# Patient Record
Sex: Female | Born: 1985 | Race: White | Hispanic: No | Marital: Single | Smoking: Never smoker
Health system: Southern US, Community
[De-identification: ages and names within clinical notes are randomized; demographics above are authoritative.]

## PROBLEM LIST (undated history)

## (undated) DIAGNOSIS — I10 Essential (primary) hypertension: Secondary | ICD-10-CM

---

## 2019-06-18 ENCOUNTER — Other Ambulatory Visit: Payer: Self-pay

## 2019-06-18 ENCOUNTER — Encounter: Payer: Self-pay | Admitting: Emergency Medicine

## 2019-06-18 ENCOUNTER — Emergency Department (INDEPENDENT_AMBULATORY_CARE_PROVIDER_SITE_OTHER)
Admission: EM | Admit: 2019-06-18 | Discharge: 2019-06-18 | Disposition: A | Discharge status: Home / Self Care | Payer: Managed Care, Other (non HMO) | Source: Home / Self Care

## 2019-06-18 ENCOUNTER — Emergency Department (INDEPENDENT_AMBULATORY_CARE_PROVIDER_SITE_OTHER): Payer: Managed Care, Other (non HMO)

## 2019-06-18 DIAGNOSIS — M25572 Pain in left ankle and joints of left foot: Secondary | ICD-10-CM

## 2019-06-18 DIAGNOSIS — S93402A Sprain of unspecified ligament of left ankle, initial encounter: Secondary | ICD-10-CM

## 2019-06-18 HISTORY — DX: Essential (primary) hypertension: I10

## 2019-06-18 MED ORDER — HYDROCODONE-ACETAMINOPHEN 5-325 MG PO TABS: 1.0000 | ORAL_TABLET | Freq: Four times a day (QID) | ORAL | 0 refills | Status: DC | PRN

## 2019-06-18 MED ORDER — IBUPROFEN 600 MG PO TABS: 600.0000 mg | ORAL_TABLET | Freq: Four times a day (QID) | ORAL | 0 refills | Status: DC | PRN

## 2019-06-18 NOTE — ED Provider Notes (Signed)
[facility]    CSN: [id number] Arrival date & time: 06/18/19  1052      History   Chief Complaint Chief Complaint  Patient presents with  . Ankle Pain    HPI Charlene Quinn is a 33 y.o. female.   HPI  Charlene Quinn is a 33 y.o. female presenting to UC with c/o sudden onset, gradually worsening Left ankle pain and swelling that started last night after falling off the last step of a staircase in her house.  Pain is aching and throbbing, 6/10, worse with movement and weightbearing.  She has applied ice and took ibuprofen with mild relief. No other injuries from the fall.  She used a knee walker scooter to come into UC today.   Past Medical History:  Diagnosis Date  . Hypertension     There are no active problems to display for this patient.   History reviewed. No pertinent surgical history.  OB History   No obstetric history on file.      Home Medications    Prior to Admission medications   Medication Sig Start Date End Date Taking? Authorizing Provider  hydrochlorothiazide (MICROZIDE) 12.5 MG capsule Take by mouth. 11/30/17  Yes [provider]  levonorgestrel (MIRENA) 20 MCG/24HR IUD by Intrauterine route.   Yes [provider]  HYDROcodone-acetaminophen (NORCO/VICODIN) 5-325 MG tablet Take 1-2 tablets by mouth every 6 (six) hours as needed for moderate pain or severe pain. 06/18/19   Noe Gens, PA-C  ibuprofen (ADVIL) 600 MG tablet Take 1 tablet (600 mg total) by mouth every 6 (six) hours as needed. 06/18/19   Noe Gens, PA-C    Family History No family history on file.  Social History Social History   Tobacco Use  . Smoking status: Never Smoker  . Smokeless tobacco: Never Used  Substance Use Topics  . Alcohol use: Not on file  . Drug use: Not on file     Allergies   Lisinopril   Review of Systems Review of Systems  Musculoskeletal: Positive for arthralgias, gait problem and joint swelling.  Skin: Positive  for color change. Negative for wound.  Neurological: Positive for weakness ( Left ankle due to pain). Negative for numbness.     Physical Exam Triage Vital Signs ED Triage Vitals [06/18/19 1103]  Enc Vitals Group     BP (!) 176/83     Pulse Rate (!) 110     Resp 17     Temp 98.5 F (36.9 C)     Temp Source Oral     SpO2 97 %     Weight 165 lb (74.8 kg)     Height 5\' 4"  (1.626 m)     Head Circumference      Peak Flow      Pain Score      Pain Loc      Pain Edu?      Excl. in [place]?    No data found.  Updated Vital Signs BP (!) 176/83 (BP Location: Right Arm)   Pulse 99   Temp 98.5 F (36.9 C) (Oral)   Resp 17   Ht 5\' 4"  (1.626 m)   Wt 165 lb (74.8 kg)   SpO2 97%   BMI 28.32 kg/m       Physical Exam Vitals signs and nursing note reviewed.  Constitutional:      Appearance: She is well-developed.  HENT:     Head: Normocephalic and atraumatic.  Neck:  Musculoskeletal: Normal range of motion.  Cardiovascular:     Rate and Rhythm: Normal rate.     Pulses:          Dorsalis pedis pulses are 2+ on the left side.       Posterior tibial pulses are 2+ on the left side.     Comments: Initially tachycardic in triage, regular rate and rhythm on exam. Pulmonary:     Effort: Pulmonary effort is normal.  Musculoskeletal:        General: Swelling and tenderness present.     Comments: Left ankle: moderate to significant edema. Mild diffuse tenderness to medial, lateral and anterior aspect. Limited dorsiflexion and plantarflexion due to pain.  Calf is soft, non-tender. No tenderness of foot, full ROM all toes.  Skin:    General: Skin is warm and dry.     Capillary Refill: Capillary refill takes less than 2 seconds.     Findings: Bruising present.     Comments: Left ankle: skin in tact. Faint ecchymosis   Neurological:     Mental Status: She is alert and oriented to person, place, and time.     Comments: Left foot: normal sensation  Psychiatric:        Behavior:  Behavior normal.      UC Treatments / Results  Labs (all labs ordered are listed, but only abnormal results are displayed) Labs Reviewed - No data to display  EKG   Radiology Dg Ankle Complete Left  Result Date: 06/18/2019 CLINICAL DATA:  33 year old female with left ankle pain and swelling EXAM: LEFT ANKLE COMPLETE - 3+ VIEW COMPARISON:  None. FINDINGS: Significant swelling overlying the distal left fibula, with lesser degree of swelling at the medial malleolus. No displaced fracture identified. No radiopaque foreign body. Lateral view demonstrates tibiotalar joint effusion. IMPRESSION: Significant soft tissue swelling overlying the distal left fibula with no acute bony abnormality. Likely small tibiotalar joint effusion. Electronically Signed   By: Gilmer Mor D.O.   On: 06/18/2019 11:38    Procedures Procedures (including critical care time)  Medications Ordered in UC Medications - No data to display  Initial Impression / Assessment and Plan / UC Course  I have reviewed the triage vital signs and the nursing notes.  Pertinent labs & imaging results that were available during my care of the patient were reviewed by me and considered in my medical decision making (see chart for details).     Discussed imaging with pt  Will treat as severe ankle sprain. Offered crutches, pt prefers to use the knee walker scooter Ace wrap and ankle splint provided Work note and AVS provided  Final Clinical Impressions(s) / UC Diagnoses   Final diagnoses:  Severe ankle sprain, left, initial encounter     Discharge Instructions      You may take 500mg  acetaminophen every 4-6 hours or in combination with ibuprofen 400-600mg  every 6-8 hours as needed for pain and inflammation.  Norco/Vicodin (hydrocodone-acetaminophen) is a narcotic pain medication, do not combine these medications with others containing tylenol. While taking, do not drink alcohol, drive, or perform any other activities  that requires focus while taking these medications.   Call tomorrow to schedule an appointment with Sports Medicine later this week for further evaluation and treatment of your ankle injury.     ED Prescriptions    Medication Sig Dispense Auth. Provider   HYDROcodone-acetaminophen (NORCO/VICODIN) 5-325 MG tablet Take 1-2 tablets by mouth every 6 (six) hours as needed for moderate pain  or severe pain. 12 tablet Doroteo GlassmanPhelps, Tmya Wigington O, PA-C   ibuprofen (ADVIL) 600 MG tablet Take 1 tablet (600 mg total) by mouth every 6 (six) hours as needed. 30 tablet Lurene ShadowPhelps, Haaris Metallo O, [place]PA-C     I have reviewed the PDMP during this encounter.   Lurene Shadowhelps, Jolynne Spurgin O, [place]-C 06/18/19 1211

## 2019-06-18 NOTE — Discharge Instructions (Signed)
°  You may take 500mg  acetaminophen every 4-6 hours or in combination with ibuprofen 400-600mg  every 6-8 hours as needed for pain and inflammation.  Norco/Vicodin (hydrocodone-acetaminophen) is a narcotic pain medication, do not combine these medications with others containing tylenol. While taking, do not drink alcohol, drive, or perform any other activities that requires focus while taking these medications.   Call tomorrow to schedule an appointment with Sports Medicine later this week for further evaluation and treatment of your ankle injury.

## 2019-06-18 NOTE — ED Triage Notes (Signed)
Patient fell last night, having left ankle pain

## 2019-06-19 ENCOUNTER — Encounter: Payer: Self-pay | Admitting: Sports Medicine

## 2019-06-19 ENCOUNTER — Ambulatory Visit (INDEPENDENT_AMBULATORY_CARE_PROVIDER_SITE_OTHER): Payer: Managed Care, Other (non HMO) | Admitting: Sports Medicine

## 2019-06-19 DIAGNOSIS — S93492A Sprain of other ligament of left ankle, initial encounter: Secondary | ICD-10-CM | POA: Diagnosis not present

## 2019-06-19 DIAGNOSIS — T148XXA Other injury of unspecified body region, initial encounter: Secondary | ICD-10-CM | POA: Insufficient documentation

## 2019-06-19 DIAGNOSIS — S93402A Sprain of unspecified ligament of left ankle, initial encounter: Secondary | ICD-10-CM | POA: Insufficient documentation

## 2019-06-19 NOTE — Assessment & Plan Note (Signed)
Suspected grade 3 ATFL sprain with positive anterior drawer sign. Continue ASO. Continue pain medications, nonweightbearing with crutches for now. Seated duty only at work, I do suspect she will need short-term disability.

## 2019-06-19 NOTE — Progress Notes (Signed)
Subjective:    CC: Ankle injury  HPI:  This is a very pleasant 33 year old female, she fell and twisted her ankle, she had immediate pain, swelling, bruising, was seen in urgent care where x-rays were negative for fracture, she was placed in an ASO and referred to me for further evaluation.  Pain is severe, localized at the ATFL without radiation.  I reviewed the past medical history, family history, social history, surgical history, and allergies today and no changes were needed.  Please see the problem list section below in epic for further details.  Past Medical History: Past Medical History:  Diagnosis Date  . Hypertension    Past Surgical History: No past surgical history on file. Social History: Social History   Socioeconomic History  . Marital status: Single    Spouse name: Not on file  . Number of children: Not on file  . Years of education: Not on file  . Highest education level: Not on file  Occupational History  . Not on file  Social Needs  . Financial resource strain: Not on file  . Food insecurity    Worry: Not on file    Inability: Not on file  . Transportation needs    Medical: Not on file    Non-medical: Not on file  Tobacco Use  . Smoking status: Never Smoker  . Smokeless tobacco: Never Used  Substance and Sexual Activity  . Alcohol use: Not on file  . Drug use: Not on file  . Sexual activity: Not on file  Lifestyle  . Physical activity    Days per week: Not on file    Minutes per session: Not on file  . Stress: Not on file  Relationships  . Social Herbalist on phone: Not on file    Gets together: Not on file    Attends religious service: Not on file    Active member of club or organization: Not on file    Attends meetings of clubs or organizations: Not on file    Relationship status: Not on file  Other Topics Concern  . Not on file  Social History Narrative  . Not on file   Family History: No family history on file.  Allergies: Allergies  Allergen Reactions  . Lisinopril Cough and Other (See Comments)   Medications: See med rec.  Review of Systems: No headache, visual changes, nausea, vomiting, diarrhea, constipation, dizziness, abdominal pain, skin rash, fevers, chills, night sweats, swollen lymph nodes, weight loss, chest pain, body aches, joint swelling, muscle aches, shortness of breath, mood changes, visual or auditory hallucinations.  Objective:    General: Well Developed, well nourished, and in no acute distress.  Neuro: Alert and oriented x3, extra-ocular muscles intact, sensation grossly intact.  HEENT: Normocephalic, atraumatic, pupils equal round reactive to light, neck supple, no masses, no lymphadenopathy, thyroid nonpalpable.  Skin: Warm and dry, no rashes noted.  Cardiac: Regular rate and rhythm, no murmurs rubs or gallops.  Respiratory: Clear to auscultation bilaterally. Not using accessory muscles, speaking in full sentences.  Abdominal: Soft, nontender, nondistended, positive bowel sounds, no masses, no organomegaly.  Left ankle: Swollen, bruised Range of motion is full in all directions. Strength is 5/5 in all directions. Unstable lateral ligaments, positive anterior drawer sign. Talar dome nontender; No pain at base of 5th MT; No tenderness over cuboid; No tenderness over N spot or navicular prominence No tenderness on posterior aspects of lateral and medial malleolus No sign of peroneal tendon  subluxations; Negative tarsal tunnel tinel's Unable to bear weight  X-rays reviewed and are negative for obvious fracture.  Impression and Recommendations:    The patient was counselled, risk factors were discussed, anticipatory guidance given.  Sprain of ankle, left Suspected grade 3 ATFL sprain with positive anterior drawer sign. Continue ASO. Continue pain medications, nonweightbearing with crutches for now. Seated duty only at work, I do suspect she will need short-term  disability.   ___________________________________________ Ihor Austin. Benjamin Stain, M.D., ABFM., CAQSM. Primary Care and Sports Medicine [facility]  Adjunct Professor of Family Medicine  [employer]

## 2019-06-21 ENCOUNTER — Telehealth: Payer: Self-pay | Admitting: Sports Medicine

## 2019-06-21 NOTE — Telephone Encounter (Signed)
Disability/FMLA paperwork filled out

## 2019-07-03 ENCOUNTER — Other Ambulatory Visit: Payer: Self-pay

## 2019-07-03 ENCOUNTER — Encounter: Payer: Self-pay | Admitting: Sports Medicine

## 2019-07-03 ENCOUNTER — Ambulatory Visit (INDEPENDENT_AMBULATORY_CARE_PROVIDER_SITE_OTHER): Payer: Managed Care, Other (non HMO)

## 2019-07-03 ENCOUNTER — Ambulatory Visit (INDEPENDENT_AMBULATORY_CARE_PROVIDER_SITE_OTHER): Payer: Managed Care, Other (non HMO) | Admitting: Sports Medicine

## 2019-07-03 DIAGNOSIS — S93492D Sprain of other ligament of left ankle, subsequent encounter: Secondary | ICD-10-CM

## 2019-07-03 MED ORDER — HYDROCODONE-ACETAMINOPHEN 5-325 MG PO TABS: 1.0000 | ORAL_TABLET | Freq: Two times a day (BID) | ORAL | 0 refills | Status: DC | PRN

## 2019-07-03 NOTE — Assessment & Plan Note (Signed)
Still with persistent pain in the ankle, question talar OCD laterally, x-rays were negative, continuing to have difficulty bearing weight. MRI today, continue wrap and ASO, stay out of work for now.

## 2019-07-03 NOTE — Progress Notes (Signed)
Subjective:    CC: Follow-up  HPI: This is a pleasant 33 year old female, she had a severe injury in her left ankle, she returns today after 2 weeks of nonweightbearing with a ASO.  Still has significant discomfort.  I reviewed the past medical history, family history, social history, surgical history, and allergies today and no changes were needed.  Please see the problem list section below in epic for further details.  Past Medical History: Past Medical History:  Diagnosis Date  . Hypertension    Past Surgical History: No past surgical history on file. Social History: Social History   Socioeconomic History  . Marital status: Single    Spouse name: Not on file  . Number of children: Not on file  . Years of education: Not on file  . Highest education level: Not on file  Occupational History  . Not on file  Social Needs  . Financial resource strain: Not on file  . Food insecurity    Worry: Not on file    Inability: Not on file  . Transportation needs    Medical: Not on file    Non-medical: Not on file  Tobacco Use  . Smoking status: Never Smoker  . Smokeless tobacco: Never Used  Substance and Sexual Activity  . Alcohol use: Not on file  . Drug use: Not on file  . Sexual activity: Not on file  Lifestyle  . Physical activity    Days per week: Not on file    Minutes per session: Not on file  . Stress: Not on file  Relationships  . Social Herbalist on phone: Not on file    Gets together: Not on file    Attends religious service: Not on file    Active member of club or organization: Not on file    Attends meetings of clubs or organizations: Not on file    Relationship status: Not on file  Other Topics Concern  . Not on file  Social History Narrative  . Not on file   Family History: No family history on file. Allergies: Allergies  Allergen Reactions  . Lisinopril Cough and Other (See Comments)   Medications: See med rec.  Review of Systems:  No fevers, chills, night sweats, weight loss, chest pain, or shortness of breath.   Objective:    General: Well Developed, well nourished, and in no acute distress.  Neuro: Alert and oriented x3, extra-ocular muscles intact, sensation grossly intact.  HEENT: Normocephalic, atraumatic, pupils equal round reactive to light, neck supple, no masses, no lymphadenopathy, thyroid nonpalpable.  Skin: Warm and dry, no rashes. Cardiac: Regular rate and rhythm, no murmurs rubs or gallops, no lower extremity edema.  Respiratory: Clear to auscultation bilaterally. Not using accessory muscles, speaking in full sentences. Left ankle: Swollen, bruised Range of motion is full in all directions. Strength is 5/5 in all directions. Squeeze and Kleiger tests are negative, lateral ligaments are unstable Tender to palpation over the lateral talar dome No pain at base of 5th MT; No tenderness over cuboid; No tenderness over N spot or navicular prominence Tender behind the lateral malleolus No sign of peroneal tendon subluxations; Negative tarsal tunnel tinel's Unable to bear weight  Impression and Recommendations:    Sprain of ankle, left Still with persistent pain in the ankle, question talar OCD laterally, x-rays were negative, continuing to have difficulty bearing weight. MRI today, continue wrap and ASO, stay out of work for now.   ___________________________________________ Gwen Her.  Dianah Field, M.D., ABFM., CAQSM. Primary Care and Sports Medicine [facility]  Adjunct Professor of [employer]

## 2019-07-05 NOTE — Addendum Note (Signed)
Addended by: Silverio Decamp on: 07/05/2019 04:45 PM   Modules accepted: Orders

## 2019-07-13 ENCOUNTER — Ambulatory Visit: Payer: Managed Care, Other (non HMO) | Admitting: Rehabilitative and Restorative Service Providers"

## 2019-07-17 ENCOUNTER — Ambulatory Visit (INDEPENDENT_AMBULATORY_CARE_PROVIDER_SITE_OTHER): Payer: Managed Care, Other (non HMO) | Admitting: Rehabilitative and Restorative Service Providers"

## 2019-07-17 ENCOUNTER — Other Ambulatory Visit: Payer: Self-pay

## 2019-07-17 DIAGNOSIS — R2689 Other abnormalities of gait and mobility: Secondary | ICD-10-CM

## 2019-07-17 DIAGNOSIS — M25572 Pain in left ankle and joints of left foot: Secondary | ICD-10-CM | POA: Diagnosis not present

## 2019-07-17 DIAGNOSIS — R29898 Other symptoms and signs involving the musculoskeletal system: Secondary | ICD-10-CM

## 2019-07-17 DIAGNOSIS — M6281 Muscle weakness (generalized): Secondary | ICD-10-CM | POA: Diagnosis not present

## 2019-07-17 NOTE — Therapy (Signed)
[facility] [place] [address] [place], [place], [address] Phone: [phone]   Fax:  [phone]  Physical Therapy Evaluation  Patient Details  Name: Charlene Quinn MRN: [id number] Date of Birth: February 17, 1986 Referring Provider (PT): Monica Becton, MD   Encounter Date: 07/17/2019  PT End of Session - 07/17/19 1423    Visit Number  1    Number of Visits  16    Date for PT Re-Evaluation  09/15/19    Authorization Type  cigna 60 visits    Authorization - Visit Number  1    Authorization - Number of Visits  60    PT Start Time  1348    PT Stop Time  1432    PT Time Calculation (min)  44 min       Past Medical History:  Diagnosis Date  . Hypertension     No past surgical history on file.  There were no vitals filed for this visit.   Subjective Assessment - 07/17/19 1349    Subjective  The patient had a fall on the stairs on 06/17/2019.  She is currently using an ace wrap on the L ankle with a lace up ankle brace (ASO).  She is tolerating toe touch weight bearing only at this time and using crutches.    Pertinent History  None    Diagnostic tests  IMPRESSION:1. Bone contusions of the medial malleolus and medial talar body andneck consistent with inversion injury. No cortical fracture or talardome osteochondral lesion identified.2. Lateral ligamentous injuries involving the anterior talofibularand anterior inferior tibiofibular ligaments with probable lateralcapsular extravasation.3. The ankle tendons appear intact. 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    Patient Stated Goals  Be able to walk without crutches and pain.    Currently in Pain?  Yes    Pain Score  --   depends on amount of weight to load   Pain Location  Ankle    Pain Orientation  Left    Pain Descriptors / Indicators  Aching   notes pain in achilles region   Pain Type  Acute pain    Pain Onset  1 to 4 weeks ago    Pain Frequency  Intermittent     Aggravating Factors   weight bearing    Pain Relieving Factors  rest, non weight bearing         [facility] PT Assessment - 07/17/19 1351      Assessment   Medical Diagnosis  S93.492D (ICD-10-CM) - Sprain of anterior talofibular ligament of left ankle, subsequent encounter    Referring Provider (PT)  Monica Becton, MD    Onset Date/Surgical Date  06/17/19    Prior Therapy  none      Precautions   Precaution Comments  patient is ambulating toe touch weight bearing, she has not been limited by MD on weight bearing, reporting unable to bear weight due to significant pain    Required Braces or Orthoses  Other Brace/Splint    Other Brace/Splint  She wears ASO L ankle.  Will continue to use with weight bearing progression to provide stability initially.      Restrictions   Other Position/Activity Restrictions  WBAT      Balance Screen   Has the patient fallen in the past 6 months  Yes    How many times?  injured ankle due to fall on step    Has the patient had a decrease in activity level because of a fear of falling?  Yes   due to nature of injury   Is the patient reluctant to leave their home because of a fear of falling?   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      Observation/Other Assessments   Focus on Therapeutic Outcomes (FOTO)   29%      Sensation   Light Touch  Appears Intact      ROM / Strength   AROM / PROM / Strength  AROM;Strength;PROM      AROM   Overall AROM   Deficits    AROM Assessment Site  Ankle   in long sitting/ foot off edge of mat   Right/Left Ankle  Left    Left Ankle Dorsiflexion  -12    Left Ankle Plantar Flexion  40     Left Ankle Inversion  8   had to move through PROM before patient could elicit contrac   Left Ankle Eversion  8      PROM   Overall PROM   Deficits    PROM Assessment Site  Ankle    Right/Left Ankle  Left    Left Ankle Dorsiflexion  -5      Strength   Overall Strength Comments  *patient is able to perform ankle DF/PF and inversion/eversion against gravity.  She cannot tolerate resistance due to pain.      Flexibility   Soft Tissue Assessment /Muscle Length  yes   significant tightness in gastrocs   Hamstrings  Tightness in L hamstrings for toe touch gait pattern (knee flexed)-- added HEP stretch      Ambulation/Gait   Ambulation/Gait  Yes    Ambulation/Gait Assistance  6: Modified independent (Device/Increase time)    Ambulation Distance (Feet)  50 Feet    Assistive device  R Axillary Crutch;L Axillary Crutch    Gait Pattern  --   toe touch L   Ambulation Surface  Level;Indoor    Pre-Gait Activities  *After soft tissue, ther ex and grade I-II joint mobilization of the L ankle. progressed to standing activities with axillary crutches places L foot in stride anteriorly and working on placing foot flat and shifting weight anteriorly within tolerance.                Objective measurements completed on examination: See above findings.      [facility] Adult PT Treatment/Exercise - 07/17/19 1351      Exercises   Exercises  Ankle      Modalities   Modalities  Vasopneumatic      Vasopneumatic   Number Minutes Vasopneumatic   12 minutes    Vasopnuematic Location   Ankle    Vasopneumatic Pressure  Low    Vasopneumatic Temperature   34      Manual Therapy   Manual Therapy  Joint mobilization;Soft tissue mobilization    Manual therapy comments  in prone and long sitting to reduce pain, initiate ROM and improve overall mobility    Joint Mobilization  talocrural/tibial grade I and II joint mobilization to reduce pain and increase ROM  Soft tissue mobilization  STM in  gastrocs L and along achilles tendon       Ankle Exercises: Stretches   Gastroc Stretch  3 reps;30 seconds    Gastroc Stretch Limitations  with a belt in longsitting      Ankle Exercises: Seated   Ankle Circles/Pumps  AROM;Left;10 reps    Other Seated Ankle Exercises  ankle inversion/eversion AROM             PT Education - 07/17/19 1429    Education Details  HEP    Person(s) Educated  Patient    Methods  Explanation;Demonstration;Handout    Comprehension  Returned demonstration;Verbalized understanding       PT Short Term Goals - 07/17/19 1423      PT SHORT TERM GOAL #1   Title  The patient will return deno HEP for flexibility, strength and functional loading of the L ankle.    Time  4    Period  Weeks    Target Date  08/16/19      PT SHORT TERM GOAL #2   Title  The patient will improve AROM L ankle DF to neutral position (0 deg)    Baseline  -12 degrees.    Time  4    Period  Weeks    Target Date  08/16/19      PT SHORT TERM GOAL #3   Title  The patient will tolerate ambulation for household distances x 200 ft without a device independently to demonstrate progression of weight bearing tolerance for mobility.    Baseline  toe touch weight bearing for gait/ attempts to place foot flat, however has pain in achilles due to tightness    Time  4    Period  Weeks    Target Date  08/16/19      PT SHORT TERM GOAL #4   Title  The patient will undergo MMT when able to tolerate.    Time  4    Period  Weeks    Target Date  08/16/19        PT Long Term Goals - 07/17/19 2031      PT LONG TERM GOAL #1   Title  The patient will be indep with progression of HEP.    Time  8    Period  Weeks    Target Date  09/15/19      PT LONG TERM GOAL #2   Title  The patient will improve AROM L ankle DF to 10 degrees.    Baseline  -12 deg    Time  8    Period  Weeks    Target Date  09/15/19      PT LONG TERM GOAL #3   Title  The patient will demonstrate ambulation without  assistive device x 1200 ft without increase in L ankle pain for return to community mobility.    Time  8    Period  Weeks    Target Date  09/15/19      PT LONG TERM GOAL #4   Title  The patient will tolerate single limb stance on the left leg x 8 seconds to demonstrate improved ankle control/proprioception.    Time  8    Period  Weeks    Target Date  09/15/19      PT LONG TERM GOAL #5   Title  The patient will negotiate steps without handrails or assistive device independently with reciprocal pattern.    Time  8  Period  Weeks    Target Date  09/15/19             Plan - 07/17/19 1425    Clinical Impression Statement  The patient is a 33 year old female presenting to OP physical therapy s/p ankle injury L side on 06/17/2019 when falling on the steps.  She has undergone imaging that reveals lateral ligament injuries.  She presents to PT with tightness in L gastrocs, diminished AROM in all planes, inability to tolerate resistance due to pain, inability to load L ankle into stance phase of gait. PT initiated HEP for AROM and gastroc stretching.  Plan to progress to tolerance.    Examination-Activity Limitations  Caring for Others;Lift;Stand;Stairs;Squat;Locomotion Level    Examination-Participation Restrictions  Development worker, international aidYard Work;Community Activity;Cleaning;Other   Unable to work at this time   Stability/Clinical Decision Making  Stable/Uncomplicated    Clinical Decision Making  Low    Rehab Potential  Good    PT Frequency  2x / week    PT Duration  8 weeks    PT Treatment/Interventions  ADLs/Self Care Home Management;Gait training;Stair training;Functional mobility training;Therapeutic activities;Therapeutic exercise;Balance training;Manual techniques;Neuromuscular re-education;Patient/family education;Orthotic Fit/Training;Iontophoresis 4mg /ml Dexamethasone;Ultrasound;Cryotherapy;Vasopneumatic Device;Joint Manipulations;Passive range of motion    PT Next Visit Plan  Progress joint  mobilizations to tolerance, stretching.  For HEP:  Add bridges for partial WB, ankle alphabet if tolerable, standing partial loading to tolerance.    PT Home Exercise Plan  Access Code: [email or web address]    Consulted and Agree with Plan of Care  Patient       Patient will benefit from skilled therapeutic intervention in order to improve the following deficits and impairments:  Abnormal gait, Decreased range of motion, Decreased balance, Decreased mobility, Decreased strength, Increased edema, Impaired flexibility, Difficulty walking, Hypomobility, Pain  Visit Diagnosis: Pain in left ankle and joints of left foot  Muscle weakness (generalized)  Other abnormalities of gait and mobility  Other symptoms and signs involving the musculoskeletal system     Problem List Patient Active Problem List   Diagnosis Date Noted  . Sprain of ankle, left 06/19/2019    Ojas Coone , PT 07/17/2019, 8:47 PM  [facility] [address] [place], [place], [address] Phone: [phone]   Fax:  [phone]  Name: Conley RollsChristy Lesniewski MRN: [id number] Date of Birth: [birth year]

## 2019-07-17 NOTE — Patient Instructions (Signed)
Access Code: [email or web address]  URL: [email or web address]  Date: 07/17/2019  Prepared by: Rudell Cobb   Exercises Long Sitting Calf Stretch with Strap - 3 reps - 1 sets - 30 seconds hold - 2-3x daily - 7x weekly Seated Ankle Pumps - 10 reps - 1 sets - 2-3x daily - 7x weekly Supine Ankle Inversion AROM - 10 reps - 3 sets - 2x daily - 7x weekly Supine Ankle Eversion AROM - 10 reps - 3 sets - 2x daily - 7x weekly Seated Hamstring Stretch with Chair - 3 reps - 1 sets - 30 seconds hold - 2x daily - 7x weekly

## 2019-07-17 NOTE — Therapy (Deleted)
[facility] [place] [address] [place], [place], [address] Phone: [phone]   Fax:  [phone]  Physical Therapy Treatment  Patient Details  Name: Charlene Quinn MRN: [id number] Date of Birth: 11-12-1985 Referring Provider (PT): Monica Becton, MD   Encounter Date: 07/17/2019  PT End of Session - 07/17/19 1423    Visit Number  1    Number of Visits  16    Date for PT Re-Evaluation  09/15/19    Authorization Type  cigna 60 visits    Authorization - Visit Number  1    Authorization - Number of Visits  60    PT Start Time  1348    PT Stop Time  1432    PT Time Calculation (min)  44 min       Past Medical History:  Diagnosis Date  . Hypertension     No past surgical history on file.  There were no vitals filed for this visit.  Subjective Assessment - 07/17/19 1349    Subjective  The patient had a fall on the stairs on 06/17/2019.  She is currently using an ace wrap on the L ankle with a lace up ankle brace (ASO).  She is tolerating toe touch weight bearing only at this time and using crutches.    Pertinent History  None    Diagnostic tests  IMPRESSION:1. Bone contusions of the medial malleolus and medial talar body andneck consistent with inversion injury. No cortical fracture or talardome osteochondral lesion identified.2. Lateral ligamentous injuries involving the anterior talofibularand anterior inferior tibiofibular ligaments with probable lateralcapsular extravasation.3. The ankle tendons appear intact. 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    Patient Stated Goals  Be able to walk without crutches and pain.    Currently in Pain?  Yes    Pain Score  --   depends on amount of weight to load   Pain Location  Ankle    Pain Orientation  Left    Pain Descriptors / Indicators  Aching   notes pain in achilles region   Pain Type  Acute pain    Pain Onset  1 to 4 weeks ago    Pain Frequency  Intermittent     Aggravating Factors   weight bearing    Pain Relieving Factors  rest, non weight bearing         [facility] PT Assessment - 07/17/19 1351      Assessment   Medical Diagnosis  S93.492D (ICD-10-CM) - Sprain of anterior talofibular ligament of left ankle, subsequent encounter    Referring Provider (PT)  Monica Becton, MD    Onset Date/Surgical Date  06/17/19    Prior Therapy  none      Precautions   Precaution Comments  patient is ambulating toe touch weight bearing, she has not been limited by MD on weight bearing, reporting unable to bear weight due to significant pain    Required Braces or Orthoses  Other Brace/Splint    Other Brace/Splint  She wears ASO L ankle.  Will continue to use with weight bearing progression to provide stability initially.      Restrictions   Other Position/Activity Restrictions  WBAT      Balance Screen   Has the patient fallen in the past 6 months  Yes    How many times?  injured ankle due to fall on step    Has the patient had a decrease in activity level because of a fear of falling?  Yes   due to nature of injury   Is the patient reluctant to leave their home because of a fear of falling?   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      Observation/Other Assessments   Focus on Therapeutic Outcomes (FOTO)   29%      Sensation   Light Touch  Appears Intact      ROM / Strength   AROM / PROM / Strength  AROM;Strength;PROM      AROM   Overall AROM   Deficits    AROM Assessment Site  Ankle   in long sitting/ foot off edge of mat   Right/Left Ankle  Left    Left Ankle Dorsiflexion  -12    Left Ankle Plantar Flexion  40     Left Ankle Inversion  8   had to move through PROM before patient could elicit contrac   Left Ankle Eversion  8      PROM   Overall PROM   Deficits    PROM Assessment Site  Ankle    Right/Left Ankle  Left    Left Ankle Dorsiflexion  -5      Strength   Overall Strength Comments  *patient is able to perform ankle DF/PF and inversion/eversion against gravity.  She cannot tolerate resistance due to pain.      Flexibility   Soft Tissue Assessment /Muscle Length  yes   significant tightness in gastrocs   Hamstrings  Tightness in L hamstrings for toe touch gait pattern (knee flexed)-- added HEP stretch      Ambulation/Gait   Ambulation/Gait  Yes    Ambulation/Gait Assistance  6: Modified independent (Device/Increase time)    Ambulation Distance (Feet)  50 Feet    Assistive device  R Axillary Crutch;L Axillary Crutch    Gait Pattern  --   toe touch L   Ambulation Surface  Level;Indoor    Pre-Gait Activities  *After soft tissue, ther ex and grade I-II joint mobilization of the L ankle. progressed to standing activities with axillary crutches places L foot in stride anteriorly and working on placing foot flat and shifting weight anteriorly within tolerance.                   [facility] - 07/17/19 1351      Exercises   Exercises  Ankle      Modalities   Modalities  Vasopneumatic      Vasopneumatic   Number Minutes Vasopneumatic   12 minutes    Vasopnuematic Location   Ankle    Vasopneumatic Pressure  Low    Vasopneumatic Temperature   34      Manual Therapy   Manual Therapy  Joint mobilization;Soft tissue mobilization    Manual therapy comments  in prone and long sitting to reduce pain, initiate ROM and improve overall mobility    Joint Mobilization  talocrural/tibial grade I and II joint mobilization to reduce pain and increase ROM     Soft tissue mobilization  STM in  gastrocs L and along achilles tendon       Ankle Exercises: Stretches    Gastroc Stretch  3 reps;30 seconds    Gastroc Stretch Limitations  with a belt in longsitting      Ankle Exercises: Seated   Ankle Circles/Pumps  AROM;Left;10 reps    Other Seated Ankle Exercises  ankle inversion/eversion AROM             PT Education - 07/17/19 1429    Education Details  HEP    Person(s) Educated  Patient    Methods  Explanation;Demonstration;Handout    Comprehension  Returned demonstration;Verbalized understanding       PT Short Term Goals - 07/17/19 1423      PT SHORT TERM GOAL #1   Title  The patient will return deno HEP for flexibility, strength and functional loading of the L ankle.    Time  4    Period  Weeks    Target Date  08/16/19      PT SHORT TERM GOAL #2   Title  The patient will improve AROM L ankle DF to neutral position (0 deg)    Baseline  -12 degrees.    Time  4    Period  Weeks    Target Date  08/16/19      PT SHORT TERM GOAL #3   Title  The patient will tolerate ambulation for household distances x 200 ft without a device independently to demonstrate progression of weight bearing tolerance for mobility.    Baseline  toe touch weight bearing for gait/ attempts to place foot flat, however has pain in achilles due to tightness    Time  4    Period  Weeks    Target Date  08/16/19      PT SHORT TERM GOAL #4   Title  The patient will undergo MMT when able to tolerate.    Time  4    Period  Weeks    Target Date  08/16/19        PT Long Term Goals - 07/17/19 2031      PT LONG TERM GOAL #1   Title  The patient will be indep with progression of HEP.    Time  8    Period  Weeks    Target Date  09/15/19      PT LONG TERM GOAL #2   Title  The patient will improve AROM L ankle DF to 10 degrees.    Baseline  -12 deg    Time  8    Period  Weeks    Target Date  09/15/19      PT LONG TERM GOAL #3   Title  The patient will demonstrate ambulation without assistive device x 1200 ft without increase in L ankle pain for return to  community mobility.    Time  8    Period  Weeks    Target Date  09/15/19      PT LONG TERM GOAL #4   Title  The patient will tolerate single limb stance on the left leg x 8 seconds to demonstrate improved ankle control/proprioception.    Time  8    Period  Weeks    Target Date  09/15/19      PT LONG TERM GOAL #5   Title  The patient will negotiate steps without handrails or assistive device independently with reciprocal pattern.    Time  8    Period  Weeks  Target Date  09/15/19            Plan - 07/17/19 1425    Clinical Impression Statement  The patient is a 33 year old female presenting to OP physical therapy s/p ankle injury L side on 06/17/2019 when falling on the steps.  She has undergone imaging that reveals lateral ligament injuries.  She presents to PT with tightness in L gastrocs, diminished AROM in all planes, inability to tolerate resistance due to pain, inability to load L ankle into stance phase of gait. PT initiated HEP for AROM and gastroc stretching.  Plan to progress to tolerance.    Examination-Activity Limitations  Caring for Others;Lift;Stand;Stairs;Squat;Locomotion Level    Examination-Participation Restrictions  Management consultant;Other   Unable to work at this time   Stability/Clinical Decision Making  Stable/Uncomplicated    Clinical Decision Making  Low    Rehab Potential  Good    PT Frequency  2x / week    PT Duration  8 weeks    PT Treatment/Interventions  ADLs/Self Care Home Management;Gait training;Stair training;Functional mobility training;Therapeutic activities;Therapeutic exercise;Balance training;Manual techniques;Neuromuscular re-education;Patient/family education;Orthotic Fit/Training;Iontophoresis 4mg /ml Dexamethasone;Ultrasound;Cryotherapy;Vasopneumatic Device;Joint Manipulations;Passive range of motion    PT Next Visit Plan  Progress joint mobilizations to tolerance, stretching.  For HEP:  Add bridges for partial WB, ankle  alphabet if tolerable, standing partial loading to tolerance.    PT Home Exercise Plan  Access Code: [email or web address]    Consulted and Agree with Plan of Care  Patient       Patient will benefit from skilled therapeutic intervention in order to improve the following deficits and impairments:  Abnormal gait, Decreased range of motion, Decreased balance, Decreased mobility, Decreased strength, Increased edema, Impaired flexibility, Difficulty walking, Hypomobility, Pain  Visit Diagnosis: Pain in left ankle and joints of left foot  Muscle weakness (generalized)  Other abnormalities of gait and mobility  Other symptoms and signs involving the musculoskeletal system     Problem List Patient Active Problem List   Diagnosis Date Noted  . Sprain of ankle, left 06/19/2019    Tracina Beaumont, PT 07/17/2019, 8:46 PM  [facility] [place], [place], [address] Phone: [phone]   Fax:  [phone]  Name: Charlene Quinn MRN: [id number] Date of Birth: [birth year]

## 2019-07-19 ENCOUNTER — Ambulatory Visit (INDEPENDENT_AMBULATORY_CARE_PROVIDER_SITE_OTHER): Payer: Managed Care, Other (non HMO) | Admitting: Physical Therapy

## 2019-07-19 ENCOUNTER — Other Ambulatory Visit: Payer: Self-pay

## 2019-07-19 DIAGNOSIS — R2689 Other abnormalities of gait and mobility: Secondary | ICD-10-CM | POA: Diagnosis not present

## 2019-07-19 DIAGNOSIS — R29898 Other symptoms and signs involving the musculoskeletal system: Secondary | ICD-10-CM

## 2019-07-19 DIAGNOSIS — M6281 Muscle weakness (generalized): Secondary | ICD-10-CM | POA: Diagnosis not present

## 2019-07-19 DIAGNOSIS — M25572 Pain in left ankle and joints of left foot: Secondary | ICD-10-CM

## 2019-07-19 NOTE — Therapy (Signed)
[place] [address], [place], [address] Phone: [phone]   Fax:  [phone]  Physical Therapy Treatment  Patient Details  Name: Charlene Quinn MRN: [id number] Date of Birth: [birth year] Referring Provider (PT): Silverio Decamp, MD   Encounter Date: 07/19/2019  PT End of Session - 07/19/19 1515    Visit Number  2    Number of Visits  16    Date for PT Re-Evaluation  09/15/19    Authorization Type  cigna 60 visits    PT Start Time  6294    PT Stop Time  1520    PT Time Calculation (min)  48 min       Past Medical History:  Diagnosis Date  . Hypertension     No past surgical history on file.  There were no vitals filed for this visit.  Subjective Assessment - 07/19/19 1643    Subjective  Pt reports no new changes since last visit.  She ambulates into therapy NWB LLE with bilat axillary crutches (that she is borrowing).    Patient Stated Goals  Be able to walk without crutches and pain.    Currently in Pain?  No/denies   up to 5/10 pain with weight bearing on LLE        [facility] PT Assessment - 07/19/19 0001      PROM   Right/Left Ankle  Left    Left Ankle Dorsiflexion  -5       [facility] Adult PT Treatment/Exercise - 07/19/19 0001      Self-Care   Self-Care  Other Self-Care Comments    Other Self-Care Comments   pt educated re: compression sock to assist with edema reduction (to be worn under brace)      Vasopneumatic   Number Minutes Vasopneumatic   10 minutes    Vasopnuematic Location   Ankle    Vasopneumatic Pressure  Medium    Vasopneumatic Temperature   34      Manual Therapy   Manual Therapy  Taping    Soft tissue mobilization  IASTM and STM to Lt gastroc/soleus, beside Achilles -to decrease fascial restrictions.     Kinesiotex  Edema      Kinesiotix   Edema  I strip of reg rock tape applied with 15% stretch over lateral Lt ankle (split around the lateral malleoli      Ankle Exercises: Stretches   Soleus Stretch  3 reps;20 seconds   seated with strap   Gastroc Stretch  3 reps;30 seconds   long sitting with strap      Ankle Exercises: Seated   ABC's  1 rep    Ankle Circles/Pumps  AROM;Left;10 reps    Heel Raises  Both;10 reps    Toe Raise  10 reps   2 sets   Heel Slides  Left;10 reps   limited to tolerance   Other Seated Ankle Exercises  ankle inversion/eversion AROM x 5 reps       Ankle Exercises: Aerobic   Nustep  trial, working on flattening foot on pedal - x 4 min (limited tolerance)      Ankle Exercises: Standing   Other Standing Ankle Exercises  pt educated on weight shifts at counter, once Lt DF improves (demonstrated by PTA, but not performed)               PT Short Term Goals - 07/17/19 1423      PT SHORT TERM GOAL #1  Title  The patient will return deno HEP for flexibility, strength and functional loading of the L ankle.    Time  4    Period  Weeks    Target Date  08/16/19      PT SHORT TERM GOAL #2   Title  The patient will improve AROM L ankle DF to neutral position (0 deg)    Baseline  -12 degrees.    Time  4    Period  Weeks    Target Date  08/16/19      PT SHORT TERM GOAL #3   Title  The patient will tolerate ambulation for household distances x 200 ft without a device independently to demonstrate progression of weight bearing tolerance for mobility.    Baseline  toe touch weight bearing for gait/ attempts to place foot flat, however has pain in achilles due to tightness    Time  4    Period  Weeks    Target Date  08/16/19      PT SHORT TERM GOAL #4   Title  The patient will undergo MMT when able to tolerate.    Time  4    Period  Weeks    Target Date  08/16/19        PT Long Term Goals - 07/17/19 2031      PT LONG TERM GOAL #1   Title  The patient will be indep with progression of HEP.    Time  8    Period  Weeks    Target Date  09/15/19      PT LONG TERM GOAL #2   Title  The patient will  improve AROM L ankle DF to 10 degrees.    Baseline  -12 deg    Time  8    Period  Weeks    Target Date  09/15/19      PT LONG TERM GOAL #3   Title  The patient will demonstrate ambulation without assistive device x 1200 ft without increase in L ankle pain for return to community mobility.    Time  8    Period  Weeks    Target Date  09/15/19      PT LONG TERM GOAL #4   Title  The patient will tolerate single limb stance on the left leg x 8 seconds to demonstrate improved ankle control/proprioception.    Time  8    Period  Weeks    Target Date  09/15/19      PT LONG TERM GOAL #5   Title  The patient will negotiate steps without handrails or assistive device independently with reciprocal pattern.    Time  8    Period  Weeks    Target Date  09/15/19            Plan - 07/19/19 1641    Clinical Impression Statement  Pt continues to ambulate with crutches and TTWB with Lt foot.  Lt calf and ankle remain tight with ROM exercises.  Encouraged pt to be diligent with AROM and stretches to return to normal gait pattern. Crutches are a little too tall for her making it difficult to work on gait pattern; recommended borrowing cane in future as ROM improves in ankle. Pt had slight increase in pain with stretches; reduced with use of vaso at end of session.  Goals are ongoing at this time.    Examination-Activity Limitations  Caring for Others;Lift;Stand;Stairs;Squat;Locomotion Level    Examination-Participation Restrictions  Yard Work;Community Activity;Cleaning;Other   Unable to work at this time   Stability/Clinical Decision Making  Stable/Uncomplicated    Rehab Potential  Good    PT Frequency  2x / week    PT Duration  8 weeks    PT Treatment/Interventions  ADLs/Self Care Home Management;Gait training;Stair training;Functional mobility training;Therapeutic activities;Therapeutic exercise;Balance training;Manual techniques;Neuromuscular re-education;Patient/family education;Orthotic  Fit/Training;Iontophoresis 4mg /ml Dexamethasone;Ultrasound;Cryotherapy;Vasopneumatic Device;Joint Manipulations;Passive range of motion    PT Next Visit Plan  Progress joint mobilizations to tolerance, stretching.  For HEP:  Add bridges for partial WB, ankle alphabet if tolerable, standing partial loading to tolerance.    PT Home Exercise Plan  Access Code: [email or web address]    Consulted and Agree with Plan of Care  Patient       Patient will benefit from skilled therapeutic intervention in order to improve the following deficits and impairments:  Abnormal gait, Decreased range of motion, Decreased balance, Decreased mobility, Decreased strength, Increased edema, Impaired flexibility, Difficulty walking, Hypomobility, Pain  Visit Diagnosis: Pain in left ankle and joints of left foot  Muscle weakness (generalized)  Other abnormalities of gait and mobility  Other symptoms and signs involving the musculoskeletal system     Problem List Patient Active Problem List   Diagnosis Date Noted  . Sprain of ankle, left 06/19/2019   Mayer CamelJennifer Carlson-Long, PTA 07/19/19 4:51 PM  [facility] [address] [place], [place], [address] Phone: [phone]   Fax:  [phone]  Name: Conley RollsChristy Zachar MRN: [id number] Date of Birth: 28-Jun-1986

## 2019-07-19 NOTE — Patient Instructions (Signed)

## 2019-07-24 ENCOUNTER — Ambulatory Visit (INDEPENDENT_AMBULATORY_CARE_PROVIDER_SITE_OTHER): Payer: Managed Care, Other (non HMO) | Admitting: Rehabilitative and Restorative Service Providers"

## 2019-07-24 ENCOUNTER — Other Ambulatory Visit: Payer: Self-pay

## 2019-07-24 ENCOUNTER — Encounter: Payer: Self-pay | Admitting: Rehabilitative and Restorative Service Providers"

## 2019-07-24 DIAGNOSIS — M25572 Pain in left ankle and joints of left foot: Secondary | ICD-10-CM | POA: Diagnosis not present

## 2019-07-24 DIAGNOSIS — M6281 Muscle weakness (generalized): Secondary | ICD-10-CM | POA: Diagnosis not present

## 2019-07-24 DIAGNOSIS — R2689 Other abnormalities of gait and mobility: Secondary | ICD-10-CM | POA: Diagnosis not present

## 2019-07-24 DIAGNOSIS — R29898 Other symptoms and signs involving the musculoskeletal system: Secondary | ICD-10-CM

## 2019-07-24 NOTE — Patient Instructions (Signed)
Access Code: [email or web address]  URL: [email or web address]  Date: 07/24/2019  Prepared by: Rudell Cobb   Exercises Straight Leg Raise - 10 reps - 1 sets - 2x daily - 7x weekly Sidelying Hip Abduction - 10 reps - 1 sets - 2x daily - 7x weekly Long Sitting Calf Stretch with Strap - 3 reps - 1 sets - 30 seconds hold - 2-3x daily - 7x weekly Seated Ankle Pumps - 10 reps - 1 sets - 2-3x daily - 7x weekly Supine Ankle Inversion AROM - 10 reps - 3 sets - 2x daily - 7x weekly Supine Ankle Eversion AROM - 10 reps - 3 sets - 2x daily - 7x weekly Seated Hamstring Stretch with Chair - 3 reps - 1 sets - 30 seconds hold - 2x daily - 7x weekly Seated Toe Raise - 10 reps - 1 sets - 3x daily - 7x weekly Seated Heel Raise - 10 reps - 1 sets - 3x daily - 7x weekly Seated Dorsiflexion Stretch - 10 reps - 1 sets - 20 sec hold - 3x daily - 7x weekly

## 2019-07-24 NOTE — Therapy (Signed)
[place] [address] [place] [facility] [place], [place], [address] Phone: [phone]   Fax:  [phone]  Physical Therapy Treatment  Patient Details  Name: Charlene Quinn MRN: [id number] Date of Birth: 11-Nov-1985 Referring Provider (PT): Silverio Decamp, MD   Encounter Date: 07/24/2019  PT End of Session - 07/24/19 1515    Visit Number  3    Number of Visits  16    Date for PT Re-Evaluation  09/15/19    Authorization Type  cigna 60 visits    PT Start Time  3790    PT Stop Time  1522    PT Time Calculation (min)  45 min    Activity Tolerance  Patient tolerated treatment well    Behavior During Therapy  [facility] for tasks assessed/performed       Past Medical History:  Diagnosis Date  . Hypertension     History reviewed. No pertinent surgical history.  There were no vitals filed for this visit.  Subjective Assessment - 07/24/19 1439    Subjective  The patient reports pain is a little better.  She is placing her foot flat during gait, and not placing a lot of weight through L LE.    Diagnostic tests  IMPRESSION:1. Bone contusions of the medial malleolus and medial talar body andneck consistent with inversion injury. No cortical fracture or talardome osteochondral lesion identified.2. Lateral ligamentous injuries involving the anterior talofibularand anterior inferior tibiofibular ligaments with probable lateralcapsular extravasation.3. The ankle tendons appear intact. 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    Patient Stated Goals  Be able to walk without crutches and pain.    Currently in Pain?  No/denies         [facility] PT Assessment - 07/24/19 2109      AROM   Left Ankle Dorsiflexion  0    Left Ankle Plantar Flexion  45      Ambulation/Gait   Ambulation/Gait  Yes    Ambulation/Gait Assistance  6: Modified independent (Device/Increase time)    Ambulation Distance (Feet)  100 Feet    Assistive device  R Axillary Crutch;L  Axillary Crutch    Gait Comments  Emphasis on L heel strike and attempting to load into mid stance during gait; patient gets pulling sensation posterior aspect of heel cord.                   [place] Adult PT Treatment/Exercise - 07/24/19 2109      Exercises   Exercises  Ankle      Modalities   Modalities  Vasopneumatic      Vasopneumatic   Number Minutes Vasopneumatic   10 minutes    Vasopnuematic Location   Ankle    Vasopneumatic Pressure  Low    Vasopneumatic Temperature   34      Manual Therapy   Manual Therapy  Joint mobilization    Manual therapy comments  In prone position and sitting    Joint Mobilization  In prone:  Performed gentle grade I-II ankle distraction, ankle inversion/eversion, and posterior drawer of the L ankle to encourage greater translation of the tibia over the ankle.  In sitting, performed Posterior joint mobilization while tibia translates anteriorly    Soft tissue mobilization  STM prone in gastrocs and soleous complex      Ankle Exercises: Stretches   Gastroc Stretch  2 reps;30 seconds    Gastroc Stretch Limitations  Prone PROM into greater stretch during STM      Ankle  Exercises: Standing   Other Standing Ankle Exercises  Standing weight shifting with L foot anterior in stride position keeping L knee extended as she translates weight forward with tactile cues moving tibia over talocrural joint.    Other Standing Ankle Exercises  R leg anterior with L toes on ground balancing to do mini lunge (majority of weight on R side), standing heel cord stretch with L foot posterior attempting to keep L heel down.      Ankle Exercises: Seated   BAPS  Level 2;Sitting   ant/posterior and laterally   Heel Slides  Left;10 reps    Heel Slides Limitations  with a bolster and encouraging patient to drop heel when she gets to end range moving into DF      Ankle Exercises: Supine   Other Supine Ankle Exercises  SLR x 10 reps    Other Supine Ankle Exercises   Bridges for partial weight bearing x 10 reps moving from bilateral to unilateral bridge and gentle leg rocking at hip for ankle ROM      Ankle Exercises: Sidelying   Other Sidelying Ankle Exercises  L hip abduction x 10 reps             PT Education - 07/24/19 1513    Education Details  Updated HEP adding SLR hip flexion and sidelying abduction    Person(s) Educated  Patient    Methods  Explanation;Demonstration;Handout    Comprehension  Verbalized understanding;Returned demonstration       PT Short Term Goals - 07/24/19 2108      PT SHORT TERM GOAL #1   Title  The patient will return deno HEP for flexibility, strength and functional loading of the L ankle.    Time  4    Period  Weeks    Target Date  08/16/19      PT SHORT TERM GOAL #2   Title  The patient will improve AROM L ankle DF to neutral position (0 deg)    Baseline  -12 degrees.    Time  4    Period  Weeks    Status  Achieved    Target Date  08/16/19      PT SHORT TERM GOAL #3   Title  The patient will tolerate ambulation for household distances x 200 ft without a device independently to demonstrate progression of weight bearing tolerance for mobility.    Baseline  toe touch weight bearing for gait/ attempts to place foot flat, however has pain in achilles due to tightness    Time  4    Period  Weeks    Target Date  08/16/19      PT SHORT TERM GOAL #4   Title  The patient will undergo MMT when able to tolerate.    Time  4    Period  Weeks    Target Date  08/16/19        PT Long Term Goals - 07/17/19 2031      PT LONG TERM GOAL #1   Title  The patient will be indep with progression of HEP.    Time  8    Period  Weeks    Target Date  09/15/19      PT LONG TERM GOAL #2   Title  The patient will improve AROM L ankle DF to 10 degrees.    Baseline  -12 deg    Time  8    Period  Weeks  Target Date  09/15/19      PT LONG TERM GOAL #3   Title  The patient will demonstrate ambulation without  assistive device x 1200 ft without increase in L ankle pain for return to community mobility.    Time  8    Period  Weeks    Target Date  09/15/19      PT LONG TERM GOAL #4   Title  The patient will tolerate single limb stance on the left leg x 8 seconds to demonstrate improved ankle control/proprioception.    Time  8    Period  Weeks    Target Date  09/15/19      PT LONG TERM GOAL #5   Title  The patient will negotiate steps without handrails or assistive device independently with reciprocal pattern.    Time  8    Period  Weeks    Target Date  09/15/19            Plan - 07/24/19 2135    Clinical Impression Statement  The patient met STG for ankle neutral positioning.  She is tolerating greater weight into L foot during stance phase, however after 5-10 steps reverts back to toe touch weight bearing.  PT continuing to progress to tolerance.    PT Treatment/Interventions  ADLs/Self Care Home Management;Gait training;Stair training;Functional mobility training;Therapeutic activities;Therapeutic exercise;Balance training;Manual techniques;Neuromuscular re-education;Patient/family education;Orthotic Fit/Training;Iontophoresis 43m/ml Dexamethasone;Ultrasound;Cryotherapy;Vasopneumatic Device;Joint Manipulations;Passive range of motion    PT Next Visit Plan  Progress joint mobilizations to tolerance, stretching.  For HEP:  Add bridges for partial WB, ankle alphabet if tolerable, standing partial loading to tolerance.    PT Home Exercise Plan  Access Code: [email or web address]   Consulted and Agree with Plan of Care  Patient       Patient will benefit from skilled therapeutic intervention in order to improve the following deficits and impairments:  Abnormal gait, Decreased range of motion, Decreased balance, Decreased mobility, Decreased strength, Increased edema, Impaired flexibility, Difficulty walking, Hypomobility, Pain  Visit Diagnosis: Pain in left ankle and joints of left foot  Muscle  weakness (generalized)  Other abnormalities of gait and mobility  Other symptoms and signs involving the musculoskeletal system     Problem List Patient Active Problem List   Diagnosis Date Noted  . Sprain of ankle, left 06/19/2019    Silver Achey, PT 07/24/2019, 9:36 PM  [facility] [place] [address]Phone: [phone]  Fax:  [phone] Name: CChasitty HehlMRN: [id number] of Birth: [birth year]

## 2019-07-26 ENCOUNTER — Other Ambulatory Visit: Payer: Self-pay

## 2019-07-26 ENCOUNTER — Encounter: Payer: Self-pay | Admitting: Physical Therapy

## 2019-07-26 ENCOUNTER — Ambulatory Visit (INDEPENDENT_AMBULATORY_CARE_PROVIDER_SITE_OTHER): Payer: Managed Care, Other (non HMO) | Admitting: Physical Therapy

## 2019-07-26 DIAGNOSIS — M25572 Pain in left ankle and joints of left foot: Secondary | ICD-10-CM | POA: Diagnosis not present

## 2019-07-26 DIAGNOSIS — R2689 Other abnormalities of gait and mobility: Secondary | ICD-10-CM | POA: Diagnosis not present

## 2019-07-26 DIAGNOSIS — M6281 Muscle weakness (generalized): Secondary | ICD-10-CM | POA: Diagnosis not present

## 2019-07-26 NOTE — Therapy (Signed)
[facility] [address] [place], [place], [address] Phone: [phone]   Fax:  [phone]  Physical Therapy Treatment  Patient Details  Name: Charlene Quinn MRN: [id number] Date of Birth: Feb 12, 1986 Referring Provider (PT): Monica Becton, MD   Encounter Date: 07/26/2019  PT End of Session - 07/26/19 1519    Visit Number  4    Number of Visits  16    Date for PT Re-Evaluation  09/15/19    Authorization Type  cigna 60 visits    PT Start Time  1515    PT Stop Time  1601    PT Time Calculation (min)  46 min    Activity Tolerance  Patient tolerated treatment well    Behavior During Therapy  [facility] for tasks assessed/performed       Past Medical History:  Diagnosis Date  . Hypertension     History reviewed. No pertinent surgical history.  There were no vitals filed for this visit.  Subjective Assessment - 07/26/19 1519    Subjective  Pt reports she is getting around a little better.  HEP is still difficult.    Diagnostic tests  IMPRESSION:1. Bone contusions of the medial malleolus and medial talar body andneck consistent with inversion injury. No cortical fracture or talardome osteochondral lesion identified.2. Lateral ligamentous injuries involving the anterior talofibularand anterior inferior tibiofibular ligaments with probable lateralcapsular extravasation.3. The ankle tendons appear intact. 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    Patient Stated Goals  Be able to walk without crutches and pain.    Currently in Pain?  Yes    Pain Score  3     Pain Location  Ankle    Pain Orientation  Left    Pain Descriptors / Indicators  Tightness   throbbing after exercise   Aggravating Factors   WB    Pain Relieving Factors  rest, non WB         [facility] PT Assessment - 07/26/19 0001      Assessment   Medical Diagnosis  S93.492D (ICD-10-CM) - Sprain of anterior talofibular ligament of left ankle, subsequent encounter     Referring Provider (PT)  Monica Becton, MD    Onset Date/Surgical Date  06/17/19    Next MD Visit  07/31/19    Prior Therapy  none       [facility] Adult PT Treatment/Exercise - 07/26/19 0001      Ambulation/Gait   Ambulation/Gait Assistance  6: Modified independent (Device/Increase time)    Ambulation Distance (Feet)  --   25 ft x 4 trials   Assistive device  Rolling walker    Gait Pattern  Step-through pattern;Decreased dorsiflexion - left;Decreased weight shift to left    Ambulation Surface  Indoor;Level    Pre-Gait Activities  staggered stance with RW and rolling through Lt ft x 10 reps     Gait Comments  VC for sequence, VC for increased Lt heel strike, Lt heel strike and weight shift; improved with cues and repetition      Modalities   Modalities  Programmer, applications Location  Lt ankle    Electrical Stimulation Action  IFC    Electrical Stimulation Parameters  to tolerance    Electrical Stimulation Goals  Pain      Vasopneumatic   Number Minutes Vasopneumatic   10 minutes    Vasopnuematic Location   Ankle    Vasopneumatic Pressure  Low  Vasopneumatic Temperature   34      Ankle Exercises: Seated   Heel Raises  15 reps    Toe Raise  15 reps    BAPS  Level 2;Sitting;10 reps   ant/posterior, inv/ever, CW/CCW   Heel Slides  Left;5 reps    Heel Slides Limitations  --   2 reps, limited tolerance   Other Seated Ankle Exercises  ankle inversion/eversion and DF/PF on 1/2 foam roller x 10 each       Ankle Exercises: Aerobic   Nustep  4 min for ROM, to tolerance.       Ankle Exercises: Stretches   Gastroc Stretch  4 reps;10 seconds   PROM by PTA, to tolerance   Other Stretch  prone quad stretch with strap x 30 sec x 2 on LLE, 1 rep on RLE      Weight shifts with RW onto scale x 4 reps - pt able to tolerate up to 70% of body weight.          PT Short Term Goals - 07/24/19 2108       PT SHORT TERM GOAL #1   Title  The patient will return deno HEP for flexibility, strength and functional loading of the L ankle.    Time  4    Period  Weeks    Target Date  08/16/19      PT SHORT TERM GOAL #2   Title  The patient will improve AROM L ankle DF to neutral position (0 deg)    Baseline  -12 degrees.    Time  4    Period  Weeks    Status  Achieved    Target Date  08/16/19      PT SHORT TERM GOAL #3   Title  The patient will tolerate ambulation for household distances x 200 ft without a device independently to demonstrate progression of weight bearing tolerance for mobility.    Baseline  toe touch weight bearing for gait/ attempts to place foot flat, however has pain in achilles due to tightness    Time  4    Period  Weeks    Target Date  08/16/19      PT SHORT TERM GOAL #4   Title  The patient will undergo MMT when able to tolerate.    Time  4    Period  Weeks    Target Date  08/16/19        PT Long Term Goals - 07/17/19 2031      PT LONG TERM GOAL #1   Title  The patient will be indep with progression of HEP.    Time  8    Period  Weeks    Target Date  09/15/19      PT LONG TERM GOAL #2   Title  The patient will improve AROM L ankle DF to 10 degrees.    Baseline  -12 deg    Time  8    Period  Weeks    Target Date  09/15/19      PT LONG TERM GOAL #3   Title  The patient will demonstrate ambulation without assistive device x 1200 ft without increase in L ankle pain for return to community mobility.    Time  8    Period  Weeks    Target Date  09/15/19      PT LONG TERM GOAL #4   Title  The patient will tolerate single limb stance  on the left leg x 8 seconds to demonstrate improved ankle control/proprioception.    Time  8    Period  Weeks    Target Date  09/15/19      PT LONG TERM GOAL #5   Title  The patient will negotiate steps without handrails or assistive device independently with reciprocal pattern.    Time  8    Period  Weeks    Target Date   09/15/19            Plan - 07/26/19 1553    Clinical Impression Statement  Pt able to tolerate up to 70% WB into LLE with weight shifts and improved gait pattern with use of RW.  Pt reported increase in pain up to 6/10 with gait trials; reduced with use of estim/ vaso at end of session. Progressing well towards goals.    Rehab Potential  Good    PT Frequency  2x / week    PT Duration  8 weeks    PT Treatment/Interventions  ADLs/Self Care Home Management;Gait training;Stair training;Functional mobility training;Therapeutic activities;Therapeutic exercise;Balance training;Manual techniques;Neuromuscular re-education;Patient/family education;Orthotic Fit/Training;Iontophoresis 4mg /ml Dexamethasone;Ultrasound;Cryotherapy;Vasopneumatic Device;Joint Manipulations;Passive range of motion    PT Next Visit Plan  continue progressive Lt ankle ROM, gait trials, manual therapy as indicated.  MD note.    PT Home Exercise Plan  Access Code: [email or web address]    Consulted and Agree with Plan of Care  Patient       Patient will benefit from skilled therapeutic intervention in order to improve the following deficits and impairments:  Abnormal gait, Decreased range of motion, Decreased balance, Decreased mobility, Decreased strength, Increased edema, Impaired flexibility, Difficulty walking, Hypomobility, Pain  Visit Diagnosis: Pain in left ankle and joints of left foot  Muscle weakness (generalized)  Other abnormalities of gait and mobility     Problem List Patient Active Problem List   Diagnosis Date Noted  . Sprain of ankle, left 06/19/2019   Mayer CamelJennifer Carlson-Long, PTA 07/26/19 4:58 PM  [facility] [address] [place], [place], [address] Phone: [phone]   Fax:  [phone]  Name: Charlene Quinn MRN: [id number] Date of Birth: [birth year]

## 2019-07-31 ENCOUNTER — Other Ambulatory Visit: Payer: Self-pay

## 2019-07-31 ENCOUNTER — Encounter: Payer: Self-pay | Admitting: Sports Medicine

## 2019-07-31 ENCOUNTER — Ambulatory Visit (INDEPENDENT_AMBULATORY_CARE_PROVIDER_SITE_OTHER): Payer: Managed Care, Other (non HMO) | Admitting: Sports Medicine

## 2019-07-31 ENCOUNTER — Ambulatory Visit (INDEPENDENT_AMBULATORY_CARE_PROVIDER_SITE_OTHER): Payer: Managed Care, Other (non HMO) | Admitting: Physical Therapy

## 2019-07-31 DIAGNOSIS — R2689 Other abnormalities of gait and mobility: Secondary | ICD-10-CM | POA: Diagnosis not present

## 2019-07-31 DIAGNOSIS — M6281 Muscle weakness (generalized): Secondary | ICD-10-CM | POA: Diagnosis not present

## 2019-07-31 DIAGNOSIS — M25572 Pain in left ankle and joints of left foot: Secondary | ICD-10-CM | POA: Diagnosis not present

## 2019-07-31 DIAGNOSIS — R29898 Other symptoms and signs involving the musculoskeletal system: Secondary | ICD-10-CM | POA: Diagnosis not present

## 2019-07-31 DIAGNOSIS — S93492D Sprain of other ligament of left ankle, subsequent encounter: Secondary | ICD-10-CM

## 2019-07-31 NOTE — Patient Instructions (Signed)
Advance weightbearing to partial weightbearing with a single crutch for 2 weeks and then full weightbearing both feet as tolerated.

## 2019-07-31 NOTE — Assessment & Plan Note (Signed)
1 month post inversion injury, MRI showed ATFL sprain and bone bruising at the talus, she is improved considerably. Continue therapy, we will advance her weightbearing to partial weightbearing with a single crutch for 2 weeks and then full weightbearing as tolerated. Return to see me in 1 month.

## 2019-07-31 NOTE — Therapy (Signed)
[place], [place], [address] Phone: [phone]   Fax:  [phone]  Physical Therapy Treatment  Patient Details  Name: Charlene Quinn MRN: [id number] Date of Birth: May 21, 1986 Referring Provider (PT): Silverio Decamp, MD   Encounter Date: 07/31/2019  PT End of Session - 07/31/19 1355    Visit Number  5    Number of Visits  16    Date for PT Re-Evaluation  09/15/19    Authorization Type  cigna 60 visits    PT Start Time  1346    PT Stop Time  1432    PT Time Calculation (min)  46 min    Activity Tolerance  Patient tolerated treatment well    Behavior During Therapy  [facility] for tasks assessed/performed       Past Medical History:  Diagnosis Date  . Hypertension     No past surgical history on file.  There were no vitals filed for this visit.  Subjective Assessment - 07/31/19 1355    Subjective  Charlene Quinn reports she walked around [employer] yesterday with brace and crutches and this agrivated ankle.  She states her ankle swelled; still swollen this morning (as she didn't ice/ elevate afterwards).    Currently in Pain?  Yes    Pain Score  4     Pain Location  Ankle    Pain Orientation  Left    Pain Descriptors / Indicators  Tightness    Aggravating Factors   WB    Pain Relieving Factors  rest, non WB         [facility] PT Assessment - 07/31/19 0001      Assessment   Medical Diagnosis  S93.492D (ICD-10-CM) - Sprain of anterior talofibular ligament of left ankle, subsequent encounter    Referring Provider (PT)  Silverio Decamp, MD    Onset Date/Surgical Date  06/17/19    Next MD Visit  07/31/19    Prior Therapy  none      AROM   Left Ankle Dorsiflexion  4    Left Ankle Plantar Flexion  44    Left Ankle Inversion  30    Left Ankle Eversion  13      [facility] Adult PT Treatment/Exercise - 07/31/19 0001      Ambulation/Gait   Ambulation Distance (Feet)  40 Feet   additional 2  trials x 15 ft with RW   Assistive device  Rolling walker    Gait Pattern  Decreased dorsiflexion - left;Decreased weight shift to left;Step-to pattern;Step-through pattern    Ambulation Surface  Level;Indoor    Pre-Gait Activities  staggered stance with RW and rolling through Lt ft x 10 reps     Gait Comments  VC for sequence, VC for increased Lt heel strike, Lt heel strike and weight shift; improved with cues and repetition      Acupuncturist Location  Lt ankle    Electrical Stimulation Action  IFC    Electrical Stimulation Parameters  to tolerance    Electrical Stimulation Goals  Pain      Vasopneumatic   Number Minutes Vasopneumatic   10 minutes    Vasopnuematic Location   Ankle   Lt   Vasopneumatic Pressure  Low    Vasopneumatic Temperature   34      Ankle Exercises: Seated   Ankle Circles/Pumps  AROM;Left;10 reps    Heel Raises  10 reps    Toe  Raise  10 reps    BAPS  Sitting;10 reps;Level 3   ant/posterior, inv/ever, CW/CCW   Heel Slides  Left;5 reps    Other Seated Ankle Exercises  Lt ankle ROM: PF/DF, Inv, Eversion     Other Seated Ankle Exercises  Lt ankle DF, inversion, eversion x 10 each with yellow band; PF with green band x 20 reps      Ankle Exercises: Stretches   Gastroc Stretch  3 reps;30 seconds   supine with strap     Ankle Exercises: Aerobic   Nustep  4 min for ROM, to tolerance.       Ankle Exercises: Standing   SLS  weight shifts with RW to SLS on LLE with intermittent UE support - able to sustain SLS x 1 sec.                 PT Short Term Goals - 07/24/19 2108      PT SHORT TERM GOAL #1   Title  The patient will return deno HEP for flexibility, strength and functional loading of the L ankle.    Time  4    Period  Weeks    Target Date  08/16/19      PT SHORT TERM GOAL #2   Title  The patient will improve AROM L ankle DF to neutral position (0 deg)    Baseline  -12 degrees.    Time  4    Period  Weeks     Status  Achieved    Target Date  08/16/19      PT SHORT TERM GOAL #3   Title  The patient will tolerate ambulation for household distances x 200 ft without a device independently to demonstrate progression of weight bearing tolerance for mobility.    Baseline  improved WB; unable to ambulate without device andhas pain in achilles due to tightness    Time  4    Period  Weeks    Target Date  08/16/19      PT SHORT TERM GOAL #4   Title  The patient will undergo MMT when able to tolerate.    Time  4    Period  Weeks    Target Date  08/16/19        PT Long Term Goals - 07/31/19 1413      PT LONG TERM GOAL #1   Title  The patient will be indep with progression of HEP.    Time  8    Period  Weeks    Status  On-going      PT LONG TERM GOAL #2   Title  The patient will improve AROM L ankle DF to 10 degrees.    Baseline  4 deg    Time  8    Period  Weeks    Status  On-going      PT LONG TERM GOAL #3   Title  The patient will demonstrate ambulation without assistive device x 1200 ft without increase in L ankle pain for return to community mobility.    Baseline  using bilat axillary crutches with minimal WB into LLE    Time  8    Period  Weeks    Status  On-going      PT LONG TERM GOAL #4   Title  The patient will tolerate single limb stance on the left leg x 8 seconds to demonstrate improved ankle control/proprioception.    Baseline  SLS x 1  sec    Time  8    Period  Weeks    Status  On-going      PT LONG TERM GOAL #5   Title  The patient will negotiate steps without handrails or assistive device independently with reciprocal pattern.    Baseline  not tested yet.    Time  8    Period  Weeks            Plan - 07/31/19 1424    Clinical Impression Statement  Pt now able to tolerate up to 100% WB into LLE (last wk only 70%), however pt reports pain up to 6-7/10 with WB.  Pt demonstrated improved Lt ankle AROM; continued limitation with DF and eversion.  Pt tolerated  seated ROM and light resistance exercises well, with minimal increase in pain. Pt making gradual progress towards established therapy goals.    Rehab Potential  Good    PT Frequency  2x / week    PT Duration  8 weeks    PT Treatment/Interventions  ADLs/Self Care Home Management;Gait training;Stair training;Functional mobility training;Therapeutic activities;Therapeutic exercise;Balance training;Manual techniques;Neuromuscular re-education;Patient/family education;Orthotic Fit/Training;Iontophoresis 4mg /ml Dexamethasone;Ultrasound;Cryotherapy;Vasopneumatic Device;Joint Manipulations;Passive range of motion    PT Next Visit Plan  possible US to achilles, manual therapy, gait, Lt ankle ROM/ strengthening.    PT Home Exercise Plan  Access Code: [email or web address], added SLS trials at sink at home.    Consulted and Agree with Plan of Care  Patient       Patient will benefit from skilled therapeutic intervention in order to improve the following deficits and impairments:  Abnormal gait, Decreased range of motion, Decreased balance, Decreased mobility, Decreased strength, Increased edema, Impaired flexibility, Difficulty walking, Hypomobility, Pain  Visit Diagnosis: Pain in left ankle and joints of left foot  Muscle weakness (generalized)  Other abnormalities of gait and mobility  Other symptoms and signs involving the musculoskeletal system     Problem List Patient Active Problem List   Diagnosis Date Noted  . Sprain of ankle, left 06/19/2019   Mayer CamelJennifer Carlson-Long, PTA 07/31/19 2:30 PM  [facility] [address] [place], [place], [address] Phone: [phone]   Fax:  [phone]  Name: Charlene Quinn MRN: [id number] Date of Birth: [birth year]

## 2019-07-31 NOTE — Progress Notes (Signed)
Subjective:    CC: Follow-up  HPI: This is a pleasant 33 year old female, she had an inversion injury about a month ago, MRI showed talar bone contusions, ATFL sprain.  She has improved considerably.  I reviewed the past medical history, family history, social history, surgical history, and allergies today and no changes were needed.  Please see the problem list section below in epic for further details.  Past Medical History: Past Medical History:  Diagnosis Date  . Hypertension    Past Surgical History: No past surgical history on file. Social History: Social History   Socioeconomic History  . Marital status: Single    Spouse name: Not on file  . Number of children: Not on file  . Years of education: Not on file  . Highest education level: Not on file  Occupational History  . Not on file  Social Needs  . Financial resource strain: Not on file  . Food insecurity    Worry: Not on file    Inability: Not on file  . Transportation needs    Medical: Not on file    Non-medical: Not on file  Tobacco Use  . Smoking status: Never Smoker  . Smokeless tobacco: Never Used  Substance and Sexual Activity  . Alcohol use: Not on file  . Drug use: Not on file  . Sexual activity: Not on file  Lifestyle  . Physical activity    Days per week: Not on file    Minutes per session: Not on file  . Stress: Not on file  Relationships  . Social Musician on phone: Not on file    Gets together: Not on file    Attends religious service: Not on file    Active member of club or organization: Not on file    Attends meetings of clubs or organizations: Not on file    Relationship status: Not on file  Other Topics Concern  . Not on file  Social History Narrative  . Not on file   Family History: No family history on file. Allergies: Allergies  Allergen Reactions  . Lisinopril Cough and Other (See Comments)   Medications: See med rec.  Review of Systems: No fevers,  chills, night sweats, weight loss, chest pain, or shortness of breath.   Objective:    General: Well Developed, well nourished, and in no acute distress.  Neuro: Alert and oriented x3, extra-ocular muscles intact, sensation grossly intact.  HEENT: Normocephalic, atraumatic, pupils equal round reactive to light, neck supple, no masses, no lymphadenopathy, thyroid nonpalpable.  Skin: Warm and dry, no rashes. Cardiac: Regular rate and rhythm, no murmurs rubs or gallops, no lower extremity edema.  Respiratory: Clear to auscultation bilaterally. Not using accessory muscles, speaking in full sentences. Left ankle: No visible erythema or swelling. Range of motion is full in all directions. Strength is 5/5 in all directions. Stable lateral and medial ligaments; squeeze test and kleiger test unremarkable; Talar dome nontender; No pain at base of 5th MT; No tenderness over cuboid; No tenderness over N spot or navicular prominence No tenderness on posterior aspects of lateral and medial malleolus No sign of peroneal tendon subluxations; Only minimal tenderness at the ATFL and dorsal distal talus Negative tarsal tunnel tinel's Able to walk 4 steps.  Impression and Recommendations:    Sprain of ankle, left 1 month post inversion injury, MRI showed ATFL sprain and bone bruising at the talus, she is improved considerably. Continue therapy, we will advance her weightbearing  to partial weightbearing with a single crutch for 2 weeks and then full weightbearing as tolerated. Return to see me in 1 month.   ___________________________________________ Gwen Her. Dianah Field, M.D., ABFM., CAQSM. Primary Care and Sports Medicine [facility]  Adjunct Professor of [employer]

## 2019-08-02 ENCOUNTER — Encounter: Payer: Self-pay | Admitting: Physical Therapy

## 2019-08-02 ENCOUNTER — Ambulatory Visit (INDEPENDENT_AMBULATORY_CARE_PROVIDER_SITE_OTHER): Payer: Managed Care, Other (non HMO) | Admitting: Physical Therapy

## 2019-08-02 ENCOUNTER — Other Ambulatory Visit: Payer: Self-pay

## 2019-08-02 DIAGNOSIS — M6281 Muscle weakness (generalized): Secondary | ICD-10-CM

## 2019-08-02 DIAGNOSIS — M25572 Pain in left ankle and joints of left foot: Secondary | ICD-10-CM

## 2019-08-02 NOTE — Therapy (Signed)
[place], [place], [address] Phone: [phone]   Fax:  [phone]  Physical Therapy Treatment  Patient Details  Name: Charlene Quinn MRN: [id number] Date of Birth: 29-Nov-1985 Referring Provider (PT): Silverio Decamp, MD   Encounter Date: 08/02/2019  PT End of Session - 08/02/19 1430    Visit Number  6    Number of Visits  16    Date for PT Re-Evaluation  09/15/19    Authorization Type  cigna 19 visits    Authorization - Visit Number  --    PT Start Time  1430    PT Stop Time  6568   PT Time Calculation (min)  48 min    Activity Tolerance  Patient tolerated treatment well    Behavior During Therapy  [facility] for tasks assessed/performed       Past Medical History:  Diagnosis Date  . Hypertension     History reviewed. No pertinent surgical history.  There were no vitals filed for this visit.  Subjective Assessment - 08/02/19 1431    Subjective  Pt states that the doctor wants her to go two weeks with one crutch and then walk with no crutches after that.    Currently in Pain?  Yes    Pain Score  2     Pain Location  Ankle    Pain Orientation  Left    Pain Descriptors / Indicators  Tightness    Pain Type  Acute pain         [facility] PT Assessment - 08/02/19 0001      Assessment   Medical Diagnosis  S93.492D (ICD-10-CM) - Sprain of anterior talofibular ligament of left ankle, subsequent encounter    Referring Provider (PT)  Silverio Decamp, MD    Onset Date/Surgical Date  06/17/19    Next MD Visit  08/28/19    Prior Therapy  none       [facility] Adult PT Treatment/Exercise - 08/02/19 0001      Vasopneumatic   Number Minutes Vasopneumatic   10 minutes    Vasopnuematic Location   Ankle   Lt   Vasopneumatic Pressure  Low    Vasopneumatic Temperature   34      Manual Therapy   Manual Therapy  Passive ROM    Joint Mobilization  Supine Lt Talocrural AP Grade 2-3    Soft  tissue mobilization  IASTM and STM to Lt gastroc and achilles tendon     Passive ROM  Lt ankle into dorsiflexion, plantarflexion, eversion, inversion; Lt toe extension/flexion      Ankle Exercises: Seated   Ankle Circles/Pumps  AROM;Left;5 reps    Heel Raises  10 reps    BAPS  Sitting;Level 3;Level 4;10 reps   DF/PF; eversion/inversion/ CW/CCW   Other Seated Ankle Exercises  Lt ankle DF, inversion, eversion x 10 each with yellow band; PF with blue band x 10 reps      Ankle Exercises: Aerobic   Recumbent Bike  L1 x 75min       Ankle Exercises: Stretches   Gastroc Stretch  2 reps;20 seconds   Lt, seated with strap               PT Short Term Goals - 07/24/19 2108      PT SHORT TERM GOAL #1   Title  The patient will return deno HEP for flexibility, strength and functional loading of the L ankle.  Time  4    Period  Weeks    Target Date  08/16/19      PT SHORT TERM GOAL #2   Title  The patient will improve AROM L ankle DF to neutral position (0 deg)    Baseline  -12 degrees.    Time  4    Period  Weeks    Status  Achieved    Target Date  08/16/19      PT SHORT TERM GOAL #3   Title  The patient will tolerate ambulation for household distances x 200 ft without a device independently to demonstrate progression of weight bearing tolerance for mobility.    Baseline  toe touch weight bearing for gait/ attempts to place foot flat, however has pain in achilles due to tightness    Time  4    Period  Weeks    Target Date  08/16/19      PT SHORT TERM GOAL #4   Title  The patient will undergo MMT when able to tolerate.    Time  4    Period  Weeks    Target Date  08/16/19        PT Long Term Goals - 07/31/19 1413      PT LONG TERM GOAL #1   Title  The patient will be indep with progression of HEP.    Time  8    Period  Weeks    Status  On-going      PT LONG TERM GOAL #2   Title  The patient will improve AROM L ankle DF to 10 degrees.    Baseline  4 deg    Time  8     Period  Weeks    Status  On-going      PT LONG TERM GOAL #3   Title  The patient will demonstrate ambulation without assistive device x 1200 ft without increase in L ankle pain for return to community mobility.    Baseline  using bilat axillary crutches with minimal WB into LLE    Time  8    Period  Weeks    Status  On-going      PT LONG TERM GOAL #4   Title  The patient will tolerate single limb stance on the left leg x 8 seconds to demonstrate improved ankle control/proprioception.    Baseline  SLS x 1 sec    Time  8    Period  Weeks    Status  On-going      PT LONG TERM GOAL #5   Title  The patient will negotiate steps without handrails or assistive device independently with reciprocal pattern.    Baseline  not tested yet.    Time  8    Period  Weeks            Plan - 08/02/19 1709    Clinical Impression Statement  Pt tolerated interventions well today without reports of increased pain. Continues with limited Lt ankle DF and guarded movement with PROM.  Pt ambulating with unilateral crutch and making progress toward LTG # 3.  Per MD, pt to be PWB x 2 wks outside of therapy; ok to work WBAT in therapy.  Will continue to benefit from continued Pt intervention for improved functional mobility.    Rehab Potential  Good    PT Frequency  2x / week    PT Duration  8 weeks    PT Treatment/Interventions  ADLs/Self Care  Home Management;Gait training;Stair training;Functional mobility training;Therapeutic activities;Therapeutic exercise;Balance training;Manual techniques;Neuromuscular re-education;Patient/family education;Orthotic Fit/Training;Iontophoresis 4mg /ml Dexamethasone;Ultrasound;Cryotherapy;Vasopneumatic Device;Joint Manipulations;Passive range of motion    PT Next Visit Plan  possible US to achilles, manual therapy, gait, Lt ankle ROM/ strengthening.    PT Home Exercise Plan  Access Code: [email or web address], added SLS trials at sink at home.    Consulted and Agree with Plan of Care   Patient       Patient will benefit from skilled therapeutic intervention in order to improve the following deficits and impairments:  Abnormal gait, Decreased range of motion, Decreased balance, Decreased mobility, Decreased strength, Increased edema, Impaired flexibility, Difficulty walking, Hypomobility, Pain  Visit Diagnosis: Pain in left ankle and joints of left foot  Muscle weakness (generalized)     Problem List Patient Active Problem List   Diagnosis Date Noted  . Sprain of ankle, left 06/19/2019    Olive BassKirsten Kesler Wickham, SPTA 08/02/2019, 5:17 PM   Mayer CamelJennifer Carlson-Long, PTA 08/02/19 5:21 PM   [facility] [address] [place], [place], [address] Phone: [phone]   Fax:  [phone]  Name: Charlene Quinn MRN: [id number] Date of Birth: April 16, 1986

## 2019-08-07 ENCOUNTER — Other Ambulatory Visit: Payer: Self-pay

## 2019-08-07 ENCOUNTER — Encounter: Payer: Self-pay | Admitting: Rehabilitative and Restorative Service Providers"

## 2019-08-07 ENCOUNTER — Ambulatory Visit (INDEPENDENT_AMBULATORY_CARE_PROVIDER_SITE_OTHER): Payer: Managed Care, Other (non HMO) | Admitting: Rehabilitative and Restorative Service Providers"

## 2019-08-07 DIAGNOSIS — M25572 Pain in left ankle and joints of left foot: Secondary | ICD-10-CM

## 2019-08-07 DIAGNOSIS — R29898 Other symptoms and signs involving the musculoskeletal system: Secondary | ICD-10-CM

## 2019-08-07 DIAGNOSIS — R2689 Other abnormalities of gait and mobility: Secondary | ICD-10-CM | POA: Diagnosis not present

## 2019-08-07 DIAGNOSIS — M6281 Muscle weakness (generalized): Secondary | ICD-10-CM

## 2019-08-07 NOTE — Therapy (Signed)
[facility] [place], [place], [address] Phone: [phone]   Fax:  [phone]  Physical Therapy Treatment  Patient Details  Name: Charlene Quinn MRN: [id number] Date of Birth: 1986/09/05 Referring Provider (PT): Silverio Decamp, MD   Encounter Date: 08/07/2019  PT End of Session - 08/07/19 1516    Visit Number  7    Number of Visits  16    Date for PT Re-Evaluation  09/15/19    Authorization Type  cign[employer] 60 visits    PT Start Time  3300    PT Stop Time  1535    PT Time Calculation (min)  50 min    Activity Tolerance  Patient tolerated treatment well;Patient limited by pain    Behavior During Therapy  [facility] for tasks assessed/performed       Past Medical History:  Diagnosis Date  . Hypertension     History reviewed. No pertinent surgical history.  There were no vitals filed for this visit.  Subjective Assessment - 08/07/19 1437    Subjective  The patient has 2/10 pain and notes increased swelling as she is weight bearing more with L LE.    Pertinent History  None    Diagnostic tests  IMPRESSION:1. Bone contusions of the medial malleolus and medial talar body andneck consistent with inversion injury. No cortical fracture or talardome osteochondral lesion identified.2. Lateral ligamentous injuries involving the anterior talofibularand anterior inferior tibiofibular ligaments with probable lateralcapsular extravasation.3. The ankle tendons appear intact. 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    Patient Stated Goals  Be able to walk without crutches and pain.    Currently in Pain?  Yes    Pain Score  2     Pain Location  Ankle    Pain Orientation  Left    Pain Descriptors / Indicators  Tightness;Aching;Throbbing    Pain Type  Acute pain    Pain Onset  More than a month ago    Pain Frequency  Intermittent    Aggravating Factors   weight bearing, throbs after being on feet longer    Pain Relieving  Factors  rest, non WB                       [facility] Adult PT Treatment/Exercise - 08/07/19 1456      Ambulation/Gait   Ambulation Distance (Feet)  150 Feet    Assistive device  R Axillary Crutch    Gait Pattern  Decreased stance time - left;Antalgic    Ambulation Surface  Level;Indoor    Pre-Gait Activities  Standing with feet in stride position with L foot forward.  Worked on translating hip anteriorly over L foot to encourage weight loading during stance phase.  Discussed ankle positioning to avoid ER and rolling into medial side of ankle (which is contributing to soreness).    Gait Comments  Emphasizing ankle positioning, loading L foot during stance phase.      Exercises   Exercises  Ankle      Modalities   Modalities  Vasopneumatic      Vasopneumatic   Number Minutes Vasopneumatic   10 minutes    Vasopnuematic Location   Ankle    Vasopneumatic Pressure  Low    Vasopneumatic Temperature   34      Manual Therapy   Manual Therapy  Passive ROM;Joint mobilization;Muscle Energy Technique;Soft tissue mobilization    Manual therapy comments  Prone    Joint Mobilization  ankle  distraction, talocrural/tibial grade II, P<>A ad A<>P grade II    Soft tissue mobilization  gastroc    Passive ROM  L ankle into dosriflexion    Muscle Energy Technique  contract relax plantarflexors for increased stretch      Ankle Exercises: Stretches   Gastroc Stretch  3 reps;30 seconds   *see manual for contract/relax     Ankle Exercises: Aerobic   Nustep  L2 x 1 minutes, L1 x 3 minutes      Ankle Exercises: Seated   Towel Crunch  5 reps;Limitations    Towel Crunch Limitations  pain and throbbing noted after exercise      Ankle Exercises: Supine   Isometrics  Long sitting with towel for eversion, inversion, plantar flexion, and DF x 10 reps x 5 second holds.    Other Supine Ankle Exercises  Bridges for partial loading x 10 reps *recommended brace for home to perform this ex              PT Education - 08/07/19 1516    Education Details  HEP progressed adding bridges, isometrics and toe towel scrunch    Person(s) Educated  Patient    Methods  Explanation;Demonstration;Handout    Comprehension  Returned demonstration;Verbalized understanding       PT Short Term Goals - 08/07/19 1845      PT SHORT TERM GOAL #1   Title  The patient will return deno HEP for flexibility, strength and functional loading of the L ankle.    Time  4    Period  Weeks    Status  Achieved    Target Date  08/16/19      PT SHORT TERM GOAL #2   Title  The patient will improve AROM L ankle DF to neutral position (0 deg)    Baseline  -12 degrees.    Time  4    Period  Weeks    Status  Achieved    Target Date  08/16/19      PT SHORT TERM GOAL #3   Title  The patient will tolerate ambulation for household distances x 200 ft without a device independently to demonstrate progression of weight bearing tolerance for mobility.    Baseline  toe touch weight bearing for gait/ attempts to place foot flat, however has pain in achilles due to tightness    Time  4    Period  Weeks    Status  On-going    Target Date  08/16/19      PT SHORT TERM GOAL #4   Title  The patient will undergo MMT when able to tolerate.    Time  4    Period  Weeks    Status  On-going    Target Date  08/16/19        PT Long Term Goals - 07/31/19 1413      PT LONG TERM GOAL #1   Title  The patient will be indep with progression of HEP.    Time  8    Period  Weeks    Status  On-going      PT LONG TERM GOAL #2   Title  The patient will improve AROM L ankle DF to 10 degrees.    Baseline  4 deg    Time  8    Period  Weeks    Status  On-going      PT LONG TERM GOAL #3   Title  The patient will  demonstrate ambulation without assistive device x 1200 ft without increase in L ankle pain for return to community mobility.    Baseline  using bilat axillary crutches with minimal WB into LLE    Time  8     Period  Weeks    Status  On-going      PT LONG TERM GOAL #4   Title  The patient will tolerate single limb stance on the left leg x 8 seconds to demonstrate improved ankle control/proprioception.    Baseline  SLS x 1 sec    Time  8    Period  Weeks    Status  On-going      PT LONG TERM GOAL #5   Title  The patient will negotiate steps without handrails or assistive device independently with reciprocal pattern.    Baseline  not tested yet.    Time  8    Period  Weeks            Plan - 08/07/19 1901    Clinical Impression Statement  The patient has met 2 STGs.  PT is continuing to progress to patient tolerance.  She is experiencing increased swelling and discomfort with increased weight bearing.  PT progressed to isometric strengthening for HEP.  Patient expressed concerns re: copays for therapy and inquired about dec'ing frequency.  PT emphasized need for daily HEP, stretching, and ice if reducing frequency in order to continue to make gains with therapy.    PT Treatment/Interventions  ADLs/Self Care Home Management;Gait training;Stair training;Functional mobility training;Therapeutic activities;Therapeutic exercise;Balance training;Manual techniques;Neuromuscular re-education;Patient/family education;Orthotic Fit/Training;Iontophoresis 89m/ml Dexamethasone;Ultrasound;Cryotherapy;Vasopneumatic Device;Joint Manipulations;Passive range of motion    PT Next Visit Plan  Check STGs, MMT for assessment, [facility]  Check HEP (isometrics, towel scrunch)    PT Home Exercise Plan  Access Code: [email or web address] added SLS trials at sink at home.    Consulted and Agree with Plan of Care  Patient       Patient will benefit from skilled therapeutic intervention in order to improve the following deficits and impairments:  Abnormal gait, Decreased range of motion, Decreased balance, Decreased mobility, Decreased strength, Increased edema,  Impaired flexibility, Difficulty walking, Hypomobility, Pain  Visit Diagnosis: Pain in left ankle and joints of left foot  Muscle weakness (generalized)  Other abnormalities of gait and mobility  Other symptoms and signs involving the musculoskeletal system     Problem List Patient Active Problem List   Diagnosis Date Noted  . Sprain of ankle, left 06/19/2019    Saori Umholtz, PT 08/07/2019, 7:10 PM  [facility] [address]Phone: [phone]  Fax:  [phone] Name: Charlene GarzonMRN: [id number] of Birth: 103-21-1987

## 2019-08-07 NOTE — Patient Instructions (Signed)
Access Code: [email or web address]  URL: [email or web address]  Date: 08/07/2019  Prepared by: Rudell Cobb   Exercises Supine Bridge - 10 reps - 1 sets - 3 seconds hold - 2x daily - 7x weekly Straight Leg Raise - 10 reps - 1 sets - 2x daily - 7x weekly Sidelying Hip Abduction - 10 reps - 1 sets - 2x daily - 7x weekly Long Sitting Calf Stretch with Strap - 3 reps - 1 sets - 30 seconds hold - 2-3x daily - 7x weekly Supine Ankle Inversion AROM - 10 reps - 3 sets - 2x daily - 7x weekly Supine Ankle Eversion AROM - 10 reps - 3 sets - 2x daily - 7x weekly Seated Hamstring Stretch with Chair - 3 reps - 1 sets - 30 seconds hold - 2x daily - 7x weekly Seated Toe Raise - 10 reps - 1 sets - 3x daily - 7x weekly Seated Heel Raise - 10 reps - 1 sets - 3x daily - 7x weekly Seated Dorsiflexion Stretch - 10 reps - 1 sets - 20 sec hold - 3x daily - 7x weekly Seated Toe Towel Scrunches - 10 reps - 1 sets - 2x daily - 7x weekly Isometric Ankle Eversion at Wall - 10 reps - 3 sets - 2x daily - 7x weekly Isometric Ankle Inversion - 10 reps - 1 sets - 2x daily - 7x weekly Long Sitting Isometric Ankle Plantarflexion with Ball at Wall - 10 reps - 1 sets - 2x daily - 7x weekly

## 2019-08-11 ENCOUNTER — Encounter: Payer: Self-pay | Admitting: Sports Medicine

## 2019-08-14 NOTE — Telephone Encounter (Signed)
Amber would you look into why she left a message and never got a call back?  Also may be just have her bring a copy of those papers to me since faxes are often dropped through the cracks.Marland Kitchen

## 2019-08-14 NOTE — Telephone Encounter (Signed)
Spoke with pt and she is having her office refax the forms to the machine here at the nurse's station.

## 2019-08-15 ENCOUNTER — Encounter: Payer: Self-pay | Admitting: Sports Medicine

## 2019-08-16 ENCOUNTER — Ambulatory Visit (INDEPENDENT_AMBULATORY_CARE_PROVIDER_SITE_OTHER): Payer: Managed Care, Other (non HMO) | Admitting: Physical Therapy

## 2019-08-16 ENCOUNTER — Encounter: Payer: Self-pay | Admitting: Physical Therapy

## 2019-08-16 ENCOUNTER — Other Ambulatory Visit: Payer: Self-pay

## 2019-08-16 DIAGNOSIS — M6281 Muscle weakness (generalized): Secondary | ICD-10-CM

## 2019-08-16 DIAGNOSIS — R2689 Other abnormalities of gait and mobility: Secondary | ICD-10-CM

## 2019-08-16 DIAGNOSIS — M25572 Pain in left ankle and joints of left foot: Secondary | ICD-10-CM | POA: Diagnosis not present

## 2019-08-16 NOTE — Therapy (Signed)
[facility]  [place], [place], [address] Phone: [phone]   Fax:  [phone]  Physical Therapy Treatment  Patient Details  Name: Charlene Quinn MRN: [id number] Date of Birth: 1985-12-21 Referring Provider (PT): Silverio Decamp, MD   Encounter Date: 08/16/2019  PT End of Session - 08/16/19 1433    Visit Number  8    Number of Visits  16    Date for PT Re-Evaluation  09/15/19    Authorization Type  cigna 60 visits    PT Start Time  1430    PT Stop Time  1519    PT Time Calculation (min)  49 min    Activity Tolerance  Patient tolerated treatment well;Patient limited by pain    Behavior During Therapy  [facility] for tasks assessed/performed       Past Medical History:  Diagnosis Date  . Hypertension     History reviewed. No pertinent surgical history.  There were no vitals filed for this visit.  Subjective Assessment - 08/16/19 1433    Subjective  Pt started walking without unilateral crutch this week. Pt states it still hurts to walk but not as much as it has before.    Currently in Pain?  Yes    Pain Score  2     Pain Location  Ankle    Pain Orientation  Left    Pain Descriptors / Indicators  Tightness;Shooting    Pain Type  Acute pain    Aggravating Factors   weight bearing    Pain Relieving Factors  rest, non WB         [facility] PT Assessment - 08/16/19 0001      Assessment   Medical Diagnosis  S93.492D (ICD-10-CM) - Sprain of anterior talofibular ligament of left ankle, subsequent encounter    Referring Provider (PT)  Silverio Decamp, MD    Onset Date/Surgical Date  06/17/19    Next MD Visit  08/28/19    Prior Therapy  none      Strength   Strength Assessment Site  Ankle    Right/Left Ankle  Left    Left Ankle Dorsiflexion  5/5        [facility] Adult PT Treatment/Exercise - 08/16/19 0001      Vasopneumatic   Number Minutes Vasopneumatic   10 minutes    Vasopnuematic  Location   Ankle    Vasopneumatic Pressure  Low    Vasopneumatic Temperature   34      Ankle Exercises: Seated   Heel Raises  Both;10 reps   B UE support on hand rail   BAPS  Sitting;Level 4   DF/PF; Inversion/Eversion, CW/CCW     Ankle Exercises: Standing   SLS  Lt SLS 3 reps up to 6 sec    Other Standing Ankle Exercises  Lateral Step up with Lt 3" step x 10 reps; Forward step up on 6" step x 10 reps; step down on 3 " step x 10 reps    cues for form    Other Standing Ankle Exercises  Sit to stand x 10 reps eccentric lowering     Ankle Exercises: Stretches   Gastroc Stretch  3 reps;20 seconds   2 reps longsitting with strap; 1 rep standing for 15 sec    Gastroc Stretch Limitations  pt had limited tolerance for stretch in standing      Ankle Exercises: Aerobic   Recumbent bike  L1 x 3 min for  warm up            PT Education - 08/16/19 1553    Education Details  [email or web address] STS, step ups, step downs    Person(s) Educated  Patient    Methods  Explanation;Demonstration;Verbal cues   Pt declined handout   Comprehension  Verbalized understanding;Returned demonstration;Verbal cues required       PT Short Term Goals - 08/16/19 1536      PT SHORT TERM GOAL #1   Title  The patient will return deno HEP for flexibility, strength and functional loading of the L ankle.    Time  4    Period  Weeks    Status  Achieved    Target Date  08/16/19      PT SHORT TERM GOAL #2   Title  The patient will improve AROM L ankle DF to neutral position (0 deg)    Baseline  -12 degrees.    Time  4    Period  Weeks    Status  Achieved    Target Date  08/16/19      PT SHORT TERM GOAL #3   Title  The patient will tolerate ambulation for household distances x 200 ft without a device independently to demonstrate progression of weight bearing tolerance for mobility.    Baseline    Time  4    Period  Weeks    Status  Achieved    Target Date  08/16/19      PT SHORT TERM GOAL #4   Title  The  patient will undergo MMT when able to tolerate.    Time  4    Period  Weeks    Status  Achieved    Target Date  08/16/19        PT Long Term Goals - 08/16/19 1550      PT LONG TERM GOAL #1   Title  The patient will be indep with progression of HEP.    Time  8    Period  Weeks    Status  On-going      PT LONG TERM GOAL #2   Title  The patient will improve AROM L ankle DF to 10 degrees.    Baseline  4 deg    Time  8    Period  Weeks    Status  On-going      PT LONG TERM GOAL #3   Title  The patient will demonstrate ambulation without assistive device x 1200 ft without increase in L ankle pain for return to community mobility.    Baseline    Time  8    Period  Weeks    Status  On-going      PT LONG TERM GOAL #4   Title  The patient will tolerate single limb stance on the left leg x 8 seconds to demonstrate improved ankle control/proprioception.    Baseline  SLS x 6 sec (08/16/19)    Time  8    Period  Weeks    Status  On-going      PT LONG TERM GOAL #5   Title  The patient will negotiate steps without handrails or assistive device independently with reciprocal pattern.    Baseline  reciprocal pattern with B handrail use    Time  8    Period  Weeks    Status  On-going            Plan - 08/16/19 1539  Clinical Impression Statement  Pt has begun ambulating without an assistive device; met STG # 3. Pt had limited tolerance to standing gastroc stretch; reported increased discomfort. Pt with better tolerance to gastroc stretch in longsitting with strap. Pt reported reduction in discomfort after vasopneumatic at end of session. Pt making progress toward LTG #4 and 5. Pt will continue to benefit from skilled PT for improved functional mobility.    Rehab Potential  Good    PT Frequency  2x / week    PT Duration  8 weeks    PT Treatment/Interventions  ADLs/Self Care Home Management;Gait training;Stair training;Functional mobility training;Therapeutic  activities;Therapeutic exercise;Balance training;Manual techniques;Neuromuscular re-education;Patient/family education;Orthotic Fit/Training;Iontophoresis 60m/ml Dexamethasone;Ultrasound;Cryotherapy;Vasopneumatic Device;Joint Manipulations;Passive range of motion    PT Next Visit Plan  Continue to progress ankle ROM, gait training, stair training, balance activities    PT Home Exercise Plan  Access Code: [email or web address] see education above   Consulted and Agree with Plan of Care  Patient       Patient will benefit from skilled therapeutic intervention in order to improve the following deficits and impairments:  Abnormal gait, Decreased range of motion, Decreased balance, Decreased mobility, Decreased strength, Increased edema, Impaired flexibility, Difficulty walking, Hypomobility, Pain  Visit Diagnosis: Pain in left ankle and joints of left foot  Muscle weakness (generalized)  Other abnormalities of gait and mobility     Problem List Patient Active Problem List   Diagnosis Date Noted  . Sprain of ankle, left 06/19/2019    KGardiner Rhyme SPTA 08/16/2019, 3:56 PM   JKerin Perna PTA 08/16/19 4:53 PM   [place] [address]Phone: [phone]  Fax:  [phone] Name: CJunia NygrenMRN: [id number] of Birth: [birth year]

## 2019-08-17 ENCOUNTER — Encounter: Payer: Self-pay | Admitting: Sports Medicine

## 2019-08-17 NOTE — Telephone Encounter (Signed)
Yall!  I don't have any forms.  Amber just get her to drop them off, fill out what you can this afternoon and I'll sign in the morning.

## 2019-08-21 ENCOUNTER — Ambulatory Visit (INDEPENDENT_AMBULATORY_CARE_PROVIDER_SITE_OTHER): Payer: Managed Care, Other (non HMO) | Admitting: Physical Therapy

## 2019-08-21 ENCOUNTER — Other Ambulatory Visit: Payer: Self-pay

## 2019-08-21 ENCOUNTER — Encounter: Payer: Self-pay | Admitting: Physical Therapy

## 2019-08-21 DIAGNOSIS — R29898 Other symptoms and signs involving the musculoskeletal system: Secondary | ICD-10-CM | POA: Diagnosis not present

## 2019-08-21 DIAGNOSIS — M25572 Pain in left ankle and joints of left foot: Secondary | ICD-10-CM | POA: Diagnosis not present

## 2019-08-21 DIAGNOSIS — R2689 Other abnormalities of gait and mobility: Secondary | ICD-10-CM

## 2019-08-21 DIAGNOSIS — M6281 Muscle weakness (generalized): Secondary | ICD-10-CM

## 2019-08-21 NOTE — Therapy (Signed)
[facility] [address] [place], [place], [address] Phone: [phone]   Fax:  [phone]  Physical Therapy Treatment  Patient Details  Name: Charlene RollsChristy Quinn MRN: [id number] Date of Birth: 1985-10-31 Referring Provider (PT): Monica Bectonhekkekandam, Thomas J, MD   Encounter Date: 08/21/2019  PT End of Session - 08/21/19 1601    Visit Number  9    Number of Visits  16    Date for PT Re-Evaluation  09/15/19    PT Start Time  1518    PT Stop Time  1600    PT Time Calculation (min)  42 min    Activity Tolerance  Patient tolerated treatment well    Behavior During Therapy  [facility] for tasks assessed/performed       Past Medical History:  Diagnosis Date  . Hypertension     History reviewed. No pertinent surgical history.  There were no vitals filed for this visit.  Subjective Assessment - 08/21/19 1711    Subjective  Pt has been walking without crutch and this has increased her pain.  She reports the (medial part of) Lt ankle has had more swelling; throbbing like a fat lip. She states after walking or exercises, her ankle becomes painful and swollen.  She continues to wear her compression sock with not much relief.    Patient Stated Goals  Be able to walk without crutches and pain.    Currently in Pain?  Yes    Pain Score  3     Pain Location  Ankle    Pain Orientation  Left    Pain Descriptors / Indicators  Tightness;Sharp;Throbbing    Aggravating Factors   walking, stairs    Pain Relieving Factors  NWB, ice         [facility] PT Assessment - 08/21/19 0001      Assessment   Medical Diagnosis  S93.492D (ICD-10-CM) - Sprain of anterior talofibular ligament of left ankle, subsequent encounter    Referring Provider (PT)  Monica Bectonhekkekandam, Thomas J, MD    Onset Date/Surgical Date  06/17/19    Next MD Visit  08/28/19    Prior Therapy  none       [facility] Adult PT Treatment/Exercise - 08/21/19 0001      Modalities   Modalities   Iontophoresis;Vasopneumatic      Iontophoresis   Type of Iontophoresis  Dexamethasone    Location  Lt medial ankle     Dose  1.0 cc    Time  80 mA patch (6 hr wear time)      Vasopneumatic   Number Minutes Vasopneumatic   10 minutes    Vasopnuematic Location   Ankle   Lt   Vasopneumatic Pressure  Medium    Vasopneumatic Temperature   34 deg      Manual Therapy   Soft tissue mobilization  gentle STM along tibial ridge for post tib with pt in sitting position.       Ankle Exercises: Seated   Ankle Circles/Pumps  AROM;Left;10 reps    BAPS  Sitting;Level 3   DF/PF; Inversion/Eversion, CW/CCW   Other Seated Ankle Exercises  Lt eversion with bilat feet, yellow band x 10       Ankle Exercises: Standing   SLS  Lt SLS 5 reps, up to 4 sec without UE support.       Ankle Exercises: Aerobic   Nustep  L4: 5.5 min (50-60 SPM)       Ankle Exercises: Stretches  Soleus Stretch  3 reps;30 seconds   seated with strap   Gastroc Stretch  2 reps;20 seconds   long sitting with strap   Slant Board Stretch  2 reps;20 seconds               PT Short Term Goals - 08/16/19 1536      PT SHORT TERM GOAL #1   Title  The patient will return deno HEP for flexibility, strength and functional loading of the L ankle.    Time  4    Period  Weeks    Status  Achieved    Target Date  08/16/19      PT SHORT TERM GOAL #2   Title  The patient will improve AROM L ankle DF to neutral position (0 deg)    Baseline  -12 degrees.    Time  4    Period  Weeks    Status  Achieved    Target Date  08/16/19      PT SHORT TERM GOAL #3   Title  The patient will tolerate ambulation for household distances x 200 ft without a device independently to demonstrate progression of weight bearing tolerance for mobility.    Baseline  toe touch weight bearing for gait/ attempts to place foot flat, however has pain in achilles due to tightness    Time  4    Period  Weeks    Status  Achieved    Target Date  08/16/19       PT SHORT TERM GOAL #4   Title  The patient will undergo MMT when able to tolerate.    Time  4    Period  Weeks    Status  Achieved    Target Date  08/16/19        PT Long Term Goals - 08/16/19 1550      PT LONG TERM GOAL #1   Title  The patient will be indep with progression of HEP.    Time  8    Period  Weeks    Status  On-going      PT LONG TERM GOAL #2   Title  The patient will improve AROM L ankle DF to 10 degrees.    Baseline  4 deg    Time  8    Period  Weeks    Status  On-going      PT LONG TERM GOAL #3   Title  The patient will demonstrate ambulation without assistive device x 1200 ft without increase in L ankle pain for return to community mobility.    Baseline  using bilat axillary crutches with minimal WB into LLE    Time  8    Period  Weeks    Status  On-going      PT LONG TERM GOAL #4   Title  The patient will tolerate single limb stance on the left leg x 8 seconds to demonstrate improved ankle control/proprioception.    Baseline  SLS x 6 sec (08/16/19)    Time  8    Period  Weeks    Status  On-going      PT LONG TERM GOAL #5   Title  The patient will negotiate steps without handrails or assistive device independently with reciprocal pattern.    Baseline  reciprocal pattern with B handrail use    Time  8    Period  Weeks    Status  On-going  Plan - 08/21/19 1706    Clinical Impression Statement  Pt tolerated Level 3 BAPS board much better than Level 4.  Pt complained of pain in medial Lt ankle with NuStep, seated resisted eversion, and limited tolerance for standing soleus stretch.  Pt very point tender along Lt post tib, especially with PF/inversion in seated position. Visible mild swelling noted in Lt medial ankle; trial of ionto patch today applied to this area. Pt making gradual progress towards LTGs.    Rehab Potential  Good    PT Frequency  2x / week    PT Duration  8 weeks    PT Treatment/Interventions  ADLs/Self Care Home  Management;Gait training;Stair training;Functional mobility training;Therapeutic activities;Therapeutic exercise;Balance training;Manual techniques;Neuromuscular re-education;Patient/family education;Orthotic Fit/Training;Iontophoresis 4mg /ml Dexamethasone;Ultrasound;Cryotherapy;Vasopneumatic Device;Joint Manipulations;Passive range of motion    PT Next Visit Plan  Continue to progress ankle ROM, gait training, stair training, balance activities    PT Home Exercise Plan  Access Code:    Consulted and Agree with Plan of Care  Patient       Patient will benefit from skilled therapeutic intervention in order to improve the following deficits and impairments:  Abnormal gait, Decreased range of motion, Decreased balance, Decreased mobility, Decreased strength, Increased edema, Impaired flexibility, Difficulty walking, Hypomobility, Pain  Visit Diagnosis: Pain in left ankle and joints of left foot  Muscle weakness (generalized)  Other abnormalities of gait and mobility  Other symptoms and signs involving the musculoskeletal system     Problem List Patient Active Problem List   Diagnosis Date Noted  . Sprain of ankle, left 06/19/2019   08/19/2019, PTA 08/21/19 5:15 PM  [facility] [address] [place], [place], [place] Phone: [phone]   Fax:  [phone]  Name: Charlene Quinn MRN: Charlene Quinn Date of Birth: [birth year]

## 2019-08-21 NOTE — Patient Instructions (Signed)

## 2019-08-24 ENCOUNTER — Encounter: Payer: Self-pay | Admitting: Rehabilitative and Restorative Service Providers"

## 2019-08-24 ENCOUNTER — Other Ambulatory Visit: Payer: Self-pay

## 2019-08-24 ENCOUNTER — Ambulatory Visit (INDEPENDENT_AMBULATORY_CARE_PROVIDER_SITE_OTHER): Payer: Managed Care, Other (non HMO) | Admitting: Rehabilitative and Restorative Service Providers"

## 2019-08-24 DIAGNOSIS — M25572 Pain in left ankle and joints of left foot: Secondary | ICD-10-CM

## 2019-08-24 DIAGNOSIS — M6281 Muscle weakness (generalized): Secondary | ICD-10-CM | POA: Diagnosis not present

## 2019-08-24 DIAGNOSIS — R29898 Other symptoms and signs involving the musculoskeletal system: Secondary | ICD-10-CM | POA: Diagnosis not present

## 2019-08-24 DIAGNOSIS — R2689 Other abnormalities of gait and mobility: Secondary | ICD-10-CM

## 2019-08-24 NOTE — Therapy (Signed)
[place], [place], [address] Phone: [phone]   Fax:  [phone]  Physical Therapy Treatment  Patient Details  Name: Charlene Quinn MRN: [id number] Date of Birth: 1986/08/20 Referring Provider (PT): Silverio Decamp, MD   Encounter Date: 08/24/2019  PT End of Session - 08/24/19 2033    Visit Number  10    Number of Visits  16    Date for PT Re-Evaluation  09/15/19    Authorization Type  [employer] 60 visits    PT Start Time  0867    PT Stop Time  1530    PT Time Calculation (min)  43 min    Activity Tolerance  Patient tolerated treatment well    Behavior During Therapy  [facility] for tasks assessed/performed       Past Medical History:  Diagnosis Date  . Hypertension     History reviewed. No pertinent surgical history.  There were no vitals filed for this visit.  Subjective Assessment - 08/24/19 1451    Subjective  The patient reports 3/10 pain today.  She notes she walked around a store for Christmas shopping and had significant increaase in pain.    Pertinent History  None    Diagnostic tests  IMPRESSION:1. Bone contusions of the medial malleolus and medial talar body andneck consistent with inversion injury. No cortical fracture or talardome osteochondral lesion identified.2. Lateral ligamentous injuries involving the anterior talofibularand anterior inferior tibiofibular ligaments with probable lateralcapsular extravasation.3. The ankle tendons appear intact. 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    Patient Stated Goals  Be able to walk without crutches and pain.    Currently in Pain?  Yes    Pain Score  3     Pain Location  Ankle    Pain Orientation  Left    Pain Descriptors / Indicators  Tightness;Aching;Sore;Throbbing    Pain Type  Acute pain    Pain Onset  More than a month ago    Pain Frequency  Intermittent    Aggravating Factors   walking    Pain Relieving Factors  NWB, ice                        [facility] Adult PT Treatment/Exercise - 08/24/19 1503      Exercises   Exercises  Ankle      Modalities   Modalities  Iontophoresis;Vasopneumatic      Iontophoresis   Type of Iontophoresis  Dexamethasone    Location  Left medial ankle    Dose  1.0 cc    Time  80 mA patch (6 hour wear time)      Vasopneumatic   Number Minutes Vasopneumatic   10 minutes    Vasopnuematic Location   Ankle    Vasopneumatic Pressure  Medium    Vasopneumatic Temperature   34 deg      Manual Therapy   Manual Therapy  Soft tissue mobilization;Joint mobilization    Manual therapy comments  prone position    Joint Mobilization  L ankle distraction, talocrural/tibial grade II joint mobilization P>A and A>P, ankle inversion/eversion grade II mobilization to improve joint mobility    Passive ROM  into dorsiflexion and ankle inversion/eversion      Ankle Exercises: Stretches   Gastroc Stretch  3 reps;30 seconds    Gastroc Stretch Limitations  Left gastroc stretch PROM prone      Ankle Exercises: Aerobic   Nustep  L3 x 3 minutes  with c/o pain      Ankle Exercises: Standing   Other Standing Ankle Exercises  Standing with L foot on 6" step mobilizing with movement as patient rocks knee anteriorly with PT providing end range stretch with forward translation of the tibia on the ankle mortise      Ankle Exercises: Seated   Heel Raises  Left;10 reps    Toe Raise  10 reps    Other Seated Ankle Exercises  ankle eversion/inversion AROM seated with pain in posterior tibialis region.               PT Short Term Goals - 08/16/19 1536      PT SHORT TERM GOAL #1   Title  The patient will return deno HEP for flexibility, strength and functional loading of the L ankle.    Time  4    Period  Weeks    Status  Achieved    Target Date  08/16/19      PT SHORT TERM GOAL #2   Title  The patient will improve AROM L ankle DF to neutral position (0 deg)    Baseline  -12 degrees.     Time  4    Period  Weeks    Status  Achieved    Target Date  08/16/19      PT SHORT TERM GOAL #3   Title  The patient will tolerate ambulation for household distances x 200 ft without a device independently to demonstrate progression of weight bearing tolerance for mobility.    Baseline  toe touch weight bearing for gait/ attempts to place foot flat, however has pain in achilles due to tightness    Time  4    Period  Weeks    Status  Achieved    Target Date  08/16/19      PT SHORT TERM GOAL #4   Title  The patient will undergo MMT when able to tolerate.    Time  4    Period  Weeks    Status  Achieved    Target Date  08/16/19        PT Long Term Goals - 08/16/19 1550      PT LONG TERM GOAL #1   Title  The patient will be indep with progression of HEP.    Time  8    Period  Weeks    Status  On-going      PT LONG TERM GOAL #2   Title  The patient will improve AROM L ankle DF to 10 degrees.    Baseline  4 deg    Time  8    Period  Weeks    Status  On-going      PT LONG TERM GOAL #3   Title  The patient will demonstrate ambulation without assistive device x 1200 ft without increase in L ankle pain for return to community mobility.    Baseline  using bilat axillary crutches with minimal WB into LLE    Time  8    Period  Weeks    Status  On-going      PT LONG TERM GOAL #4   Title  The patient will tolerate single limb stance on the left leg x 8 seconds to demonstrate improved ankle control/proprioception.    Baseline  SLS x 6 sec (08/16/19)    Time  8    Period  Weeks    Status  On-going  PT LONG TERM GOAL #5   Title  The patient will negotiate steps without handrails or assistive device independently with reciprocal pattern.    Baseline  reciprocal pattern with B handrail use    Time  8    Period  Weeks    Status  On-going            Plan - 08/24/19 1530    Clinical Impression Statement  The patient is c/o increased L medial ankle pain as she has  progressed to ambulation without a device.  PT recommended not performing standing ther ex in home and continue with isometrics in sitting, seated stretching as she is increasing weight bearing with functional tasks.  PT initiated iontophoresis last session and continued with it today to address inflammation in posterior tibialis.  Plan to continue progressing to LTGs.    PT Treatment/Interventions  ADLs/Self Care Home Management;Gait training;Stair training;Functional mobility training;Therapeutic activities;Therapeutic exercise;Balance training;Manual techniques;Neuromuscular re-education;Patient/family education;Orthotic Fit/Training;Iontophoresis 4mg /ml Dexamethasone;Ultrasound;Cryotherapy;Vasopneumatic Device;Joint Manipulations;Passive range of motion    PT Next Visit Plan  Continue to progress ankle ROM, gait training, stair training, balance activities    PT Home Exercise Plan  Access Code: [email or web address]    Consulted and Agree with Plan of Care  Patient       Patient will benefit from skilled therapeutic intervention in order to improve the following deficits and impairments:  Abnormal gait, Decreased range of motion, Decreased balance, Decreased mobility, Decreased strength, Increased edema, Impaired flexibility, Difficulty walking, Hypomobility, Pain  Visit Diagnosis: Pain in left ankle and joints of left foot  Muscle weakness (generalized)  Other abnormalities of gait and mobility  Other symptoms and signs involving the musculoskeletal system     Problem List Patient Active Problem List   Diagnosis Date Noted  . Sprain of ankle, left 06/19/2019    Lateka Rady 08/24/2019, 8:40 PM  [facility] [address] [place], [place], [address] Phone: [phone]   Fax:  [phone]  Name: Conley RollsChristy Guillermo MRN: [id number] Date of Birth: Dec 24, 1985

## 2019-08-28 ENCOUNTER — Other Ambulatory Visit: Payer: Self-pay

## 2019-08-28 ENCOUNTER — Encounter: Payer: Self-pay | Admitting: Sports Medicine

## 2019-08-28 ENCOUNTER — Ambulatory Visit (INDEPENDENT_AMBULATORY_CARE_PROVIDER_SITE_OTHER): Payer: Managed Care, Other (non HMO) | Admitting: Sports Medicine

## 2019-08-28 DIAGNOSIS — S93492D Sprain of other ligament of left ankle, subsequent encounter: Secondary | ICD-10-CM | POA: Diagnosis not present

## 2019-08-28 NOTE — Assessment & Plan Note (Signed)
Persistent discomfort, about 2 months post injury, MRI did show ATFL sprain, as well as talar bone bruising, pain was referrable to the tibiotalar joint, this was injected today with ultrasound guidance. Continue ASO, full weightbearing as tolerated. Return to see me in 1 month.

## 2019-08-28 NOTE — Progress Notes (Signed)
Subjective:    CC: Ankle pain  HPI: Charlene Quinn returns, she is a pleasant 33 year old female, she had an ankle injury about 2 months ago, ultimately we obtained an MRI that showed tears of the ATFL as well as talar bone bruising.  She returns today after being in to full weightbearing as tolerated with increasing pain deep within the ankle joint itself, moderate swelling.  Symptoms are moderate, persistent.  I reviewed the past medical history, family history, social history, surgical history, and allergies today and no changes were needed.  Please see the problem list section below in epic for further details.  Past Medical History: Past Medical History:  Diagnosis Date  . Hypertension    Past Surgical History: No past surgical history on file. Social History: Social History   Socioeconomic History  . Marital status: Single    Spouse name: Not on file  . Number of children: Not on file  . Years of education: Not on file  . Highest education level: Not on file  Occupational History  . Not on file  Tobacco Use  . Smoking status: Never Smoker  . Smokeless tobacco: Never Used  Substance and Sexual Activity  . Alcohol use: Not on file  . Drug use: Not on file  . Sexual activity: Not on file  Other Topics Concern  . Not on file  Social History Narrative  . Not on file   Social Determinants of Health   Financial Resource Strain:   . Difficulty of Paying Living Expenses: Not on file  Food Insecurity:   . Worried About Charity fundraiser in the Last Year: Not on file  . Ran Out of Food in the Last Year: Not on file  Transportation Needs:   . Lack of Transportation (Medical): Not on file  . Lack of Transportation (Non-Medical): Not on file  Physical Activity:   . Days of Exercise per Week: Not on file  . Minutes of Exercise per Session: Not on file  Stress:   . Feeling of Stress : Not on file  Social Connections:   . Frequency of Communication with Friends and Family:  Not on file  . Frequency of Social Gatherings with Friends and Family: Not on file  . Attends Religious Services: Not on file  . Active Member of Clubs or Organizations: Not on file  . Attends Archivist Meetings: Not on file  . Marital Status: Not on file   Family History: No family history on file. Allergies: Allergies  Allergen Reactions  . Lisinopril Cough and Other (See Comments)   Medications: See med rec.  Review of Systems: No fevers, chills, night sweats, weight loss, chest pain, or shortness of breath.   Objective:    General: Well Developed, well nourished, and in no acute distress.  Neuro: Alert and oriented x3, extra-ocular muscles intact, sensation grossly intact.  HEENT: Normocephalic, atraumatic, pupils equal round reactive to light, neck supple, no masses, no lymphadenopathy, thyroid nonpalpable.  Skin: Warm and dry, no rashes. Cardiac: Regular rate and rhythm, no murmurs rubs or gallops, no lower extremity edema.  Respiratory: Clear to auscultation bilaterally. Not using accessory muscles, speaking in full sentences. Right ankle: Minimally swollen with tenderness at the tibiotalar joint anteriorly. Range of motion is full in all directions. Strength is 5/5 in all directions. Stable lateral and medial ligaments; squeeze test and kleiger test unremarkable; Talar dome nontender; No pain at base of 5th MT; No tenderness over cuboid; No tenderness over N  spot or navicular prominence No tenderness on posterior aspects of lateral and medial malleolus No sign of peroneal tendon subluxations; Negative tarsal tunnel tinel's Able to walk 4 steps.  Procedure: Real-time Ultrasound Guided injection of the right tibiotalar joint Device: Samsung HS60  Verbal informed consent obtained.  Time-out conducted.  Noted no overlying erythema, induration, or other signs of local infection.  Skin prepped in a sterile fashion.  Local anesthesia: Topical Ethyl chloride.    With sterile technique and under real time ultrasound guidance:  1 cc Kenalog 40, 1 cc lidocaine, 1 cc bupivacaine injected easily completed without difficulty  Pain immediately resolved suggesting accurate placement of the medication.  Advised to call if fevers/chills, erythema, induration, drainage, or persistent bleeding.  Images permanently stored and available for review in the ultrasound unit.  Impression: Technically successful ultrasound guided injection.  Impression and Recommendations:    Sprain of ankle, left Persistent discomfort, about 2 months post injury, MRI did show ATFL sprain, as well as talar bone bruising, pain was referrable to the tibiotalar joint, this was injected today with ultrasound guidance. Continue ASO, full weightbearing as tolerated. Return to see me in 1 month.   ___________________________________________ Ihor Austin. Benjamin Stain, M.D., ABFM., CAQSM. Primary Care and Sports Medicine [facility]  Adjunct Professor of Family Medicine  [employer]

## 2019-08-31 ENCOUNTER — Ambulatory Visit (INDEPENDENT_AMBULATORY_CARE_PROVIDER_SITE_OTHER): Payer: Managed Care, Other (non HMO) | Admitting: Rehabilitative and Restorative Service Providers"

## 2019-08-31 ENCOUNTER — Other Ambulatory Visit: Payer: Self-pay

## 2019-08-31 ENCOUNTER — Encounter: Payer: Self-pay | Admitting: Rehabilitative and Restorative Service Providers"

## 2019-08-31 DIAGNOSIS — R29898 Other symptoms and signs involving the musculoskeletal system: Secondary | ICD-10-CM

## 2019-08-31 DIAGNOSIS — M6281 Muscle weakness (generalized): Secondary | ICD-10-CM | POA: Diagnosis not present

## 2019-08-31 DIAGNOSIS — M25572 Pain in left ankle and joints of left foot: Secondary | ICD-10-CM

## 2019-08-31 DIAGNOSIS — R2689 Other abnormalities of gait and mobility: Secondary | ICD-10-CM

## 2019-08-31 NOTE — Therapy (Signed)
[facility] [address] [place], [place], [address] Phone: [phone]   Fax:  [phone]  Physical Therapy Treatment  Patient Details  Name: Charlene Quinn MRN: [id number] Date of Birth: Jul 07, 1986 Referring Provider (PT): Monica Bectonhekkekandam, Thomas J, MD   Encounter Date: 08/31/2019  PT End of Session - 08/31/19 1449    Visit Number  11    Number of Visits  16    Date for PT Re-Evaluation  09/15/19    Authorization Type  cigna 60 visits    PT Start Time  1445    PT Stop Time  1530    PT Time Calculation (min)  45 min    Activity Tolerance  Patient tolerated treatment well    Behavior During Therapy  [facility]/per[facility]       Past Medical History:  Diagnosis Date  . Hypertension     History reviewed. No pertinent surgical history.  There were no vitals filed for this visit.  Subjective Assessment - 08/31/19 1448    Subjective  The patient reports no pain at rest, just a sensation of tightness.  The patient has done less activity at home this week.  [facility] has reduced ankle pain (medially).    Pertinent History  None    Diagnostic tests  IMPRESSION:1. Bone contusions of the medial malleolus and medial talar body andneck consistent with inversion injury. No cortical fracture or talardome osteochondral lesion identified.2. Lateral ligamentous injuries involving the anterior talofibularand anterior inferior tibiofibular ligaments with probable lateralcapsular extravasation.3. The ankle tendons appear intact. 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    Patient Stated Goals  Be able to walk without crutches and pain.    Currently in Pain?  No/denies         [facility] - 08/31/19 1452      AROM   Overall AROM   Deficits    Left Ankle Dorsiflexion  0   *has not stretched over the last few days*     PROM   Left Ankle Dorsiflexion  8                   [facility] Adult PT  Treatment/Exercise - 08/31/19 1452      Ambulation/Gait   Ambulation/Gait  Yes    Ambulation/Gait Assistance  7: Independent    Ambulation Distance (Feet)  150 Feet    Assistive device  None    Ambulation Surface  Level;Indoor    Gait Comments  ambulation with emphasis on weight shift left, rolling over toes, heel strike x 150 feet x 3 reps      Self-Care   Self-Care  Other Self-Care Comments    Other Self-Care Comments   ice massage discussed to manage pain after walking      Exercises   Exercises  Ankle;Other Exercises    Other Exercises   quad stretch right and left 2 reps x 30 seconds,       Ankle Exercises: Stretches   Gastroc Stretch  2 reps;30 seconds    Gastroc Stretch Limitations  left foot drop off 4" step    Other Stretch  toe flexion and extension seated PROM    Other Stretch  marble pick up - patient performs with second toe      Ankle Exercises: Aerobic   Nustep  L3 x 5 minutes      Ankle Exercises: Standing   Other Standing Ankle Exercises  Standing with L foot posterior in  stride working on press off for terminal stance with slight heel lift x 10 reps; standing with L foot anterior in stride lifting toes and ankle into DF,       Ankle Exercises: Seated   BAPS  Sitting;Level 3    BAPS Limitations  tightness in L heel cord; performed ant/post, lateral, and circles      Ankle Exercises: Supine   Other Supine Ankle Exercises  bridges x 10 (realized hips tight, therefore did quad stretches), then unilateral L bridge             PT Education - 08/31/19 1548    Education Details  updated HEP    Person(s) Educated  Patient    Methods  Explanation;Demonstration;Handout    Comprehension  Verbalized understanding;Returned demonstration       PT Short Term Goals - 08/16/19 1536      PT SHORT TERM GOAL #1   Title  The patient will return deno HEP for flexibility, strength and functional loading of the L ankle.    Time  4    Period  Weeks    Status   Achieved    Target Date  08/16/19      PT SHORT TERM GOAL #2   Title  The patient will improve AROM L ankle DF to neutral position (0 deg)    Baseline  -12 degrees.    Time  4    Period  Weeks    Status  Achieved    Target Date  08/16/19      PT SHORT TERM GOAL #3   Title  The patient will tolerate ambulation for household distances x 200 ft without a device independently to demonstrate progression of weight bearing tolerance for mobility.    Baseline  toe touch weight bearing for gait/ attempts to place foot flat, however has pain in achilles due to tightness    Time  4    Period  Weeks    Status  Achieved    Target Date  08/16/19      PT SHORT TERM GOAL #4   Title  The patient will undergo MMT when able to tolerate.    Time  4    Period  Weeks    Status  Achieved    Target Date  08/16/19        PT Long Term Goals - 08/16/19 1550      PT LONG TERM GOAL #1   Title  The patient will be indep with progression of HEP.    Time  8    Period  Weeks    Status  On-going      PT LONG TERM GOAL #2   Title  The patient will improve AROM L ankle DF to 10 degrees.    Baseline  4 deg    Time  8    Period  Weeks    Status  On-going      PT LONG TERM GOAL #3   Title  The patient will demonstrate ambulation without assistive device x 1200 ft without increase in L ankle pain for return to community mobility.    Baseline  using bilat axillary crutches with minimal WB into LLE    Time  8    Period  Weeks    Status  On-going      PT LONG TERM GOAL #4   Title  The patient will tolerate single limb stance on the left leg x 8 seconds to demonstrate  improved ankle control/proprioception.    Baseline  SLS x 6 sec (08/16/19)    Time  8    Period  Weeks    Status  On-going      PT LONG TERM GOAL #5   Title  The patient will negotiate steps without handrails or assistive device independently with reciprocal pattern.    Baseline  reciprocal pattern with B handrail use    Time  8     Period  Weeks    Status  On-going            Plan - 08/31/19 1551    Clinical Impression Statement  The patient has been less active this week and notes continued tightness through heel cord.  PT discussed emphasizing regular stretching L heel cord + working on gait to normalize pattern.  Recommended regular performance of HEP and dec'ing to 1x/week if she can participate daily in HEP.  Patient agrees with plan and working to progress walking and stretching to tolerance at home.    PT Treatment/Interventions  ADLs/Self Care Home Management;Gait training;Stair training;Functional mobility training;Therapeutic activities;Therapeutic exercise;Balance training;Manual techniques;Neuromuscular re-education;Patient/family education;Orthotic Fit/Training;Iontophoresis 4mg /ml Dexamethasone;Ultrasound;Cryotherapy;Vasopneumatic Device;Joint Manipulations;Passive range of motion    PT Next Visit Plan  Continue to progress ankle ROM, gait training, stair training, balance activities    PT Home Exercise Plan  Access Code: [email or web address]    Consulted and Agree with Plan of Care  Patient       Patient will benefit from skilled therapeutic intervention in order to improve the following deficits and impairments:  Abnormal gait, Decreased range of motion, Decreased balance, Decreased mobility, Decreased strength, Increased edema, Impaired flexibility, Difficulty walking, Hypomobility, Pain  Visit Diagnosis: Muscle weakness (generalized)  Pain in left ankle and joints of left foot  Other abnormalities of gait and mobility  Other symptoms and signs involving the musculoskeletal system     Problem List Patient Active Problem List   Diagnosis Date Noted  . Sprain of ankle, left 06/19/2019    Danaria Larsen, PT 08/31/2019, 3:55 PM  [facility] [place], [place], [address] Phone: [phone]   Fax:  [phone]  Name: Charlene Quinn MRN: [id number] Date of Birth: [birth year]

## 2019-08-31 NOTE — Patient Instructions (Signed)
Access Code: [email or web address]  URL: [email or web address]  Date: 08/31/2019  Prepared by: Rudell Cobb   Exercises Supine Bridge - 10 reps - 1 sets - 3 seconds hold - 2x daily - 7x weekly Straight Leg Raise - 10 reps - 1 sets - 2x daily - 7x weekly Sidelying Hip Abduction - 10 reps - 1 sets - 2x daily - 7x weekly Long Sitting Calf Stretch with Strap - 3 reps - 1 sets - 30 seconds hold - 2-3x daily - 7x weekly Supine Ankle Inversion AROM - 10 reps - 3 sets - 2x daily - 7x weekly Supine Ankle Eversion AROM - 10 reps - 3 sets - 2x daily - 7x weekly Seated Hamstring Stretch with Chair - 3 reps - 1 sets - 30 seconds hold - 2x daily - 7x weekly Seated Toe Raise - 10 reps - 1 sets - 3x daily - 7x weekly Seated Heel Raise - 10 reps - 1 sets - 3x daily - 7x weekly Seated Dorsiflexion Stretch - 10 reps - 1 sets - 20 sec hold - 3x daily - 7x weekly Seated Toe Towel Scrunches - 10 reps - 1 sets - 2x daily - 7x weekly Isometric Ankle Eversion at Wall - 10 reps - 3 sets - 2x daily - 7x weekly Isometric Ankle Inversion - 10 reps - 1 sets - 2x daily - 7x weekly Long Sitting Isometric Ankle Plantarflexion with Ball at [employer] - 10 reps - 1 sets - 2x daily - 7x weekly Seated Toe Flexion Extension PROM - 10 reps - 1 sets - 2x daily - 7x weekly Seated Toe Flexion Extension PROM - 10 reps - 1 sets - 2x daily - 7x weekly Patient Education Ice Massage

## 2019-09-05 ENCOUNTER — Encounter: Payer: Self-pay | Admitting: Sports Medicine

## 2019-09-05 NOTE — Progress Notes (Signed)
[email or web address]

## 2019-09-06 ENCOUNTER — Encounter: Payer: Self-pay | Admitting: Rehabilitative and Restorative Service Providers"

## 2019-09-06 ENCOUNTER — Ambulatory Visit (INDEPENDENT_AMBULATORY_CARE_PROVIDER_SITE_OTHER): Payer: Managed Care, Other (non HMO) | Admitting: Rehabilitative and Restorative Service Providers"

## 2019-09-06 ENCOUNTER — Other Ambulatory Visit: Payer: Self-pay

## 2019-09-06 DIAGNOSIS — R29898 Other symptoms and signs involving the musculoskeletal system: Secondary | ICD-10-CM

## 2019-09-06 DIAGNOSIS — M6281 Muscle weakness (generalized): Secondary | ICD-10-CM

## 2019-09-06 DIAGNOSIS — R2689 Other abnormalities of gait and mobility: Secondary | ICD-10-CM | POA: Diagnosis not present

## 2019-09-06 DIAGNOSIS — M25572 Pain in left ankle and joints of left foot: Secondary | ICD-10-CM

## 2019-09-06 NOTE — Therapy (Signed)
[place] [place], [place], [address] Phone: [phone]   Fax:  [phone]  Physical Therapy Treatment  Patient Details  Name: Charlene Quinn MRN: [id number] Date of Birth: 11-Aug-1986 Referring Provider (PT): Silverio Decamp, MD   Encounter Date: 09/06/2019  PT End of Session - 09/06/19 1010    Visit Number  12    Number of Visits  16    Date for PT Re-Evaluation  09/15/19    Authorization Type  cigna 60 visits    PT Start Time  0932    PT Stop Time  1018    PT Time Calculation (min)  46 min    Activity Tolerance  Patient tolerated treatment well    Behavior During Therapy  [facility] for tasks assessed/performed       Past Medical History:  Diagnosis Date  . Hypertension     History reviewed. No pertinent surgical history.  There were no vitals filed for this visit.  Subjective Assessment - 09/06/19 0937    Subjective  The patient reports walking more.  The injection helped significantly, however she still has pain after grocery shopping or weight bearing for extended periods.    Pertinent History  None    Diagnostic tests  IMPRESSION:1. Bone contusions of the medial malleolus and medial talar body andneck consistent with inversion injury. No cortical fracture or talardome osteochondral lesion identified.2. Lateral ligamentous injuries involving the anterior talofibularand anterior inferior tibiofibular ligaments with probable lateralcapsular extravasation.3. The ankle tendons appear intact. 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    Patient Stated Goals  Be able to walk without crutches and pain.    Currently in Pain?  No/denies    Pain Location  Ankle    Pain Orientation  Left    Pain Descriptors / Indicators  Tightness;Aching;Sore;Throbbing    Pain Type  Acute pain    Pain Onset  More than a month ago    Pain Frequency  Intermittent    Aggravating Factors   walking    Pain Relieving Factors   ice                       [facility] Adult PT Treatment/Exercise - 09/06/19 0955      Ambulation/Gait   Ambulation/Gait  Yes    Ambulation/Gait Assistance  7: Independent    Ambulation Distance (Feet)  200 Feet    Assistive device  None    Gait Comments  worked on Aeronautical engineer increasing left weight shifting      Exercises   Exercises  Ankle      Modalities   Modalities  Vasopneumatic      Vasopneumatic   Number Minutes Vasopneumatic   10 minutes    Vasopnuematic Location   Ankle    Vasopneumatic Pressure  Medium    Vasopneumatic Temperature   34 deg      Manual Therapy   Manual Therapy  Soft tissue mobilization;Joint mobilization    Manual therapy comments  prone position    Joint Mobilization  L ankle distraction, talocrural/tibial grade II joint mobilization P>A and A>P, ankle inversion/eversion grade II mobilization to improve joint mobility    Soft tissue mobilization  STM achilles tendon, trigger point release L gastroc and soleous    Passive ROM  into dorsiflexion during STM    Muscle Energy Technique  contract/relax plantar flexors      Ankle Exercises: Stretches   Gastroc Stretch  4 reps;30 seconds  Gastroc Stretch Limitations  left foot drop off 4" step    Other Stretch  incline board x 30 seconds working on anterior weight shift of hips      Ankle Exercises: Aerobic   Nustep  L4 x 5 minutes      Ankle Exercises: Standing   Heel Raises  10 reps    Toe Raise  10 reps      Ankle Exercises: Supine   Isometrics  PF into physioball     Other Supine Ankle Exercises  bridge unilateral x 10 reps on physioball               PT Short Term Goals - 08/16/19 1536      PT SHORT TERM GOAL #1   Title  The patient will return deno HEP for flexibility, strength and functional loading of the L ankle.    Time  4    Period  Weeks    Status  Achieved    Target Date  08/16/19      PT SHORT TERM GOAL #2   Title  The patient will improve AROM L  ankle DF to neutral position (0 deg)    Baseline  -12 degrees.    Time  4    Period  Weeks    Status  Achieved    Target Date  08/16/19      PT SHORT TERM GOAL #3   Title  The patient will tolerate ambulation for household distances x 200 ft without a device independently to demonstrate progression of weight bearing tolerance for mobility.    Baseline  toe touch weight bearing for gait/ attempts to place foot flat, however has pain in achilles due to tightness    Time  4    Period  Weeks    Status  Achieved    Target Date  08/16/19      PT SHORT TERM GOAL #4   Title  The patient will undergo MMT when able to tolerate.    Time  4    Period  Weeks    Status  Achieved    Target Date  08/16/19        PT Long Term Goals - 09/06/19 1323      PT LONG TERM GOAL #1   Title  The patient will be indep with progression of HEP.    Time  8    Period  Weeks    Status  On-going      PT LONG TERM GOAL #2   Title  The patient will improve AROM L ankle DF to 10 degrees.    Baseline  4 deg    Time  8    Period  Weeks    Status  On-going      PT LONG TERM GOAL #3   Title  The patient will demonstrate ambulation without assistive device x 1200 ft without increase in L ankle pain for return to community mobility.    Baseline  The patient is walking without device in community with mild increase in medial ankle pain on 09/06/19    Time  8    Period  Weeks    Status  Partially Met      PT LONG TERM GOAL #4   Title  The patient will tolerate single limb stance on the left leg x 8 seconds to demonstrate improved ankle control/proprioception.    Baseline  SLS x 6 sec (08/16/19)    Time  8  Period  Weeks    Status  On-going      PT LONG TERM GOAL #5   Title  The patient will negotiate steps without handrails or assistive device independently with reciprocal pattern.    Baseline  reciprocal pattern with B handrail use    Time  8    Period  Weeks    Status  On-going             Plan - 09/06/19 1323    Clinical Impression Statement  The patient is increasing walking tolerance and demonstrating improved mechanics this week.  She continues with limitations in L heel cord length, pain/inflammation in posterior tibialis tendon with standing activities (monitoring exercises to reduce change of tendonitis), and ankle weakness.  PT to continue progressing to patient tolerance.  Anticipate renewal x 6 more weeks at goal check next week to progress through functional mobility and returnto prior level of functioning.    PT Treatment/Interventions  ADLs/Self Care Home Management;Gait training;Stair training;Functional mobility training;Therapeutic activities;Therapeutic exercise;Balance training;Manual techniques;Neuromuscular re-education;Patient/family education;Orthotic Fit/Training;Iontophoresis 75m/ml Dexamethasone;Ultrasound;Cryotherapy;Vasopneumatic Device;Joint Manipulations;Passive range of motion    PT Next Visit Plan  CHECK LONG TERM GOALS (anticipate renewal x 6 weeks needed); Continue to progress ankle ROM, gait training, stair training, balance activities    PT Home Exercise Plan  Access Code: [email or web address]   Consulted and Agree with Plan of Care  Patient       Patient will benefit from skilled therapeutic intervention in order to improve the following deficits and impairments:  Abnormal gait, Decreased range of motion, Decreased balance, Decreased mobility, Decreased strength, Increased edema, Impaired flexibility, Difficulty walking, Hypomobility, Pain  Visit Diagnosis: Muscle weakness (generalized)  Pain in left ankle and joints of left foot  Other abnormalities of gait and mobility  Other symptoms and signs involving the musculoskeletal system     Problem List Patient Active Problem List   Diagnosis Date Noted  . Sprain of ankle, left 06/19/2019    WMax PT 09/06/2019, 1:26 PM  [facility] [address]Phone: [phone]  Fax:  [phone] Name: Charlene LampleyMRN: [id number] of Birth: [birth year]

## 2019-09-11 NOTE — Telephone Encounter (Signed)
We have sent everything requested, please contact her and see what else she needs and what number she would like it sent to

## 2019-09-13 ENCOUNTER — Ambulatory Visit (INDEPENDENT_AMBULATORY_CARE_PROVIDER_SITE_OTHER): Payer: Managed Care, Other (non HMO) | Admitting: Physical Therapy

## 2019-09-13 ENCOUNTER — Encounter: Payer: Self-pay | Admitting: Physical Therapy

## 2019-09-13 ENCOUNTER — Other Ambulatory Visit: Payer: Self-pay

## 2019-09-13 DIAGNOSIS — M25572 Pain in left ankle and joints of left foot: Secondary | ICD-10-CM | POA: Diagnosis not present

## 2019-09-13 DIAGNOSIS — R29898 Other symptoms and signs involving the musculoskeletal system: Secondary | ICD-10-CM | POA: Diagnosis not present

## 2019-09-13 DIAGNOSIS — R2689 Other abnormalities of gait and mobility: Secondary | ICD-10-CM

## 2019-09-13 DIAGNOSIS — M6281 Muscle weakness (generalized): Secondary | ICD-10-CM | POA: Diagnosis not present

## 2019-09-13 NOTE — Patient Instructions (Signed)
Access Code: [email or web address]  URL: [email or web address]  Date: 09/13/2019  Prepared by: Kerin Perna   Exercises  Added: Gastroc Stretch on Step - 3 reps - 1 sets - 15-30 hold - 2x daily - 7x weekly  Standing Dorsiflexion Self-Mobilization on Step - 3 reps - 1 sets - 15-30 hold - 2x daily - 7x weekly  Kneeling Calf Stretch - 3 reps - 1 sets - 15-30 hold - 2x daily - 7x weekly  Prone Quadriceps Stretch with Strap - 3 reps - 1 sets - 15-30 hold - 2x daily - 7x weekly

## 2019-09-13 NOTE — Therapy (Addendum)
[place], [place], [address] Phone: [phone]   Fax:  [phone]  Physical Therapy Treatment  Patient Details  Name: Charlene Quinn MRN: [id number] Date of Birth: [birth year] Referring Provider (PT): Silverio Decamp, MD   Encounter Date: 09/13/2019  PT End of Session - 09/13/19 1531    Visit Number  13    Number of Visits  16    Date for PT Re-Evaluation  09/15/19    PT Start Time  6389    PT Stop Time  1605    PT Time Calculation (min)  48 min    Activity Tolerance  Patient tolerated treatment well    Behavior During Therapy  [facility] for tasks assessed/performed       Past Medical History:  Diagnosis Date  . Hypertension     History reviewed. No pertinent surgical history.  There were no vitals filed for this visit.  Subjective Assessment - 09/13/19 1532    Subjective  Pain is ankle is not as bad as it has been.  Overall, she feels like she has plateued a bit; anxious to return to normal life where she can "get up and go". She wishes she had the injection a long time ago; it helped a great deal.    Currently in Pain?  No/denies    Pain Score  0-No pain    Pain Location  Ankle    Pain Orientation  Left         [facility] PT Assessment - 09/13/19 0001      Assessment   Medical Diagnosis  S93.492D (ICD-10-CM) - Sprain of anterior talofibular ligament of left ankle, subsequent encounter    Referring Provider (PT)  Silverio Decamp, MD    Onset Date/Surgical Date  06/17/19    Next MD Visit  09/25/19    Prior Therapy  none      AROM   Right/Left Ankle  Left    Left Ankle Dorsiflexion  4      PROM   Right/Left Ankle  Left    Left Ankle Dorsiflexion  10       [facility] Adult PT Treatment/Exercise - 09/13/19 0001      Self-Care   Other Self-Care Comments   Pt educated re: self massage to soleus with tennis ball; pt returned demo for 4 min with cues. Pt noted reduced  tenderness with increased time.       Vasopneumatic   Number Minutes Vasopneumatic   10 minutes    Vasopnuematic Location   Ankle   Lt   Vasopneumatic Pressure  Medium    Vasopneumatic Temperature   34 deg      Manual Therapy   Soft tissue mobilization  STM to Lt post tib/ soleus       Kinesiotix   Edema  --   issued strips to apply at home.      Ankle Exercises: Aerobic   Elliptical  L1: 1.5 min (very challenging, difficult with rhythm through LLE).     Other Aerobic  laps around gym in between exercises.       Ankle Exercises: Stretches   Soleus Stretch  3 reps;20 seconds   kneeling, per HEP   Gastroc Stretch  3 reps;20 seconds   heels off of step, per HEP   Other Stretch  standing Lt dorsi flexion with foot on 12" step and forward lunge x 5 reps, holding for 5-10 sec each.  Other Stretch  Prone Lt quad stretch x 3 reps of 30 sec      Ankle Exercises: Standing   SLS  Lt SLS x 2 reps - up to 30 sec without UE support.     Heel Raises  Both;10 reps   heels off of step   Other Standing Ankle Exercises  step down with RLE and retro step up with Lt ankle (focus on getting heel down, increasing DF) x 10 slow reps (knee tremulous)      Ankle Exercises: Seated   Ankle Circles/Pumps  AROM;Left;5 reps   in between exercises.      verbally reviewed current HEP.        PT Education - 09/13/19 1621    Education Details  HEP - added stretches    Person(s) Educated  Patient    Methods  Explanation;Demonstration;Verbal cues;Handout    Comprehension  Verbalized understanding;Returned demonstration       PT Short Term Goals - 08/16/19 1536      PT SHORT TERM GOAL #1   Title  The patient will return deno HEP for flexibility, strength and functional loading of the L ankle.    Time  4    Period  Weeks    Status  Achieved    Target Date  08/16/19      PT SHORT TERM GOAL #2   Title  The patient will improve AROM L ankle DF to neutral position (0 deg)    Baseline  -12  degrees.    Time  4    Period  Weeks    Status  Achieved    Target Date  08/16/19      PT SHORT TERM GOAL #3   Title  The patient will tolerate ambulation for household distances x 200 ft without a device independently to demonstrate progression of weight bearing tolerance for mobility.    Baseline  toe touch weight bearing for gait/ attempts to place foot flat, however has pain in achilles due to tightness    Time  4    Period  Weeks    Status  Achieved    Target Date  08/16/19      PT SHORT TERM GOAL #4   Title  The patient will undergo MMT when able to tolerate.    Time  4    Period  Weeks    Status  Achieved    Target Date  08/16/19        PT Long Term Goals - 09/13/19 1635      PT LONG TERM GOAL #1   Title  The patient will be indep with progression of HEP.    Time  8    Period  Weeks    Status  On-going      PT LONG TERM GOAL #2   Title  The patient will improve AROM L ankle DF to 10 degrees.    Time  8    Period  Weeks    Status  On-going      PT LONG TERM GOAL #3   Title  The patient will demonstrate ambulation without assistive device x 1200 ft without increase in L ankle pain for return to community mobility.    Baseline  The patient is walking without device in community with mild increase in medial ankle pain on 09/06/19    Time  8    Period  Weeks    Status  Partially Met  PT LONG TERM GOAL #4   Title  The patient will tolerate single limb stance on the left leg x 8 seconds to demonstrate improved ankle control/proprioception.    Time  8    Period  Weeks    Status  Achieved      PT LONG TERM GOAL #5   Title  The patient will negotiate steps without handrails or assistive device independently with reciprocal pattern.    Baseline  reciprocal pattern with B handrail use    Time  8    Period  Weeks    Status  On-going            Plan - 09/13/19 1627    Clinical Impression Statement  Pt reported some discomfort with Lt dorsi flexion  stretches; reported reduction of tightness in ankle after exercises and self massage to soleus with tennis ball.  Lt ankle DF gradually improving.  Has met LTG #4  Pt would benefit from continued PT intervention to assist with return to prior level of function (see PT note from 12/23 - anticipation of 1x/wk for 6 additional weeks).    PT Treatment/Interventions  ADLs/Self Care Home Management;Gait training;Stair training;Functional mobility training;Therapeutic activities;Therapeutic exercise;Balance training;Manual techniques;Neuromuscular re-education;Patient/family education;Orthotic Fit/Training;Iontophoresis 53m/ml Dexamethasone;Ultrasound;Cryotherapy;Vasopneumatic Device;Joint Manipulations;Passive range of motion    PT Next Visit Plan  DN to Lt soleus/ post tib/ gastroc.  check long term goals; end of POC on 09/15/19.  continue to progress ankle ROM and functional strengthening exercises.    PT Home Exercise Plan  Access Code: [email or web address]   Consulted and Agree with Plan of Care  Patient       Patient will benefit from skilled therapeutic intervention in order to improve the following deficits and impairments:  Abnormal gait, Decreased range of motion, Decreased balance, Decreased mobility, Decreased strength, Increased edema, Impaired flexibility, Difficulty walking, Hypomobility, Pain  Visit Diagnosis: Muscle weakness (generalized)  Pain in left ankle and joints of left foot  Other abnormalities of gait and mobility  Other symptoms and signs involving the musculoskeletal system     Problem List Patient Active Problem List   Diagnosis Date Noted  . Sprain of ankle, left 06/19/2019   JKerin Perna PTA 09/13/19 4:36 PM   PHYSICAL THERAPY DISCHARGE SUMMARY  Visits from Start of Care: 13  Current functional level related to goals / functional outcomes: See above   Remaining deficits: See note for patient status-- did not return to physical therapy   Education /  Equipment: HEP  Plan: Patient agrees to discharge.  Patient goals were not met. Patient is being discharged due to not returning since the last visit.  ?????    Thank you for the referral of this patient. CRudell Cobb MPT  [facility] [address]Phone: [phone]  Fax:  [phone] Name: CIndigo BarbianMRN: [id number] of Birth: [birth year]

## 2019-09-20 ENCOUNTER — Encounter: Payer: Managed Care, Other (non HMO) | Admitting: Physical Therapy

## 2019-09-21 ENCOUNTER — Encounter: Payer: Managed Care, Other (non HMO) | Admitting: Physical Therapy

## 2019-09-25 ENCOUNTER — Ambulatory Visit (INDEPENDENT_AMBULATORY_CARE_PROVIDER_SITE_OTHER): Payer: Managed Care, Other (non HMO) | Admitting: Sports Medicine

## 2019-09-25 ENCOUNTER — Other Ambulatory Visit: Payer: Self-pay

## 2019-09-25 DIAGNOSIS — S93492D Sprain of other ligament of left ankle, subsequent encounter: Secondary | ICD-10-CM

## 2019-09-25 NOTE — Progress Notes (Signed)
    Procedures performed today:    None.  Independent interpretation of tests performed by another provider:   None.  Impression and Recommendations:    Sprain of ankle, left Charlene Quinn returns, she continues to have some discomfort in her left ankle, her principal issue is lack of dorsiflexion compared with the contralateral side. She had an inversion injury about 3 months ago. She has had formal physical therapy, we did a tibiotalar joint injection, MRI showed medial malleolar and talar kissing bone contusions, partial ATFL tear, as well as some nonspecific fluid laterally. It was also recommended that she get a JAS passive stretch device. We will try to get this for her. In the meantime because it has been 3 months now would like a second opinion from Dr. Susa Simmonds. At this point she also feels like she cannot afford any more physical therapy.  She has had two full 4 months.    ___________________________________________ Ihor Austin. Benjamin Stain, M.D., ABFM., CAQSM. Primary Care and Sports Medicine [facility]  Adjunct Instructor of Family Medicine  [employer]

## 2019-09-25 NOTE — Assessment & Plan Note (Signed)
Charlene Quinn returns, she continues to have some discomfort in her left ankle, her principal issue is lack of dorsiflexion compared with the contralateral side. She had an inversion injury about 3 months ago. She has had formal physical therapy, we did a tibiotalar joint injection, MRI showed medial malleolar and talar kissing bone contusions, partial ATFL tear, as well as some nonspecific fluid laterally. It was also recommended that she get a JAS passive stretch device. We will try to get this for her. In the meantime because it has been 3 months now would like a second opinion from Dr. Susa Simmonds. At this point she also feels like she cannot afford any more physical therapy.  She has had two full 4 months.

## 2019-10-23 ENCOUNTER — Other Ambulatory Visit: Payer: Self-pay

## 2019-10-23 ENCOUNTER — Ambulatory Visit (INDEPENDENT_AMBULATORY_CARE_PROVIDER_SITE_OTHER): Payer: Managed Care, Other (non HMO) | Admitting: Sports Medicine

## 2019-10-23 ENCOUNTER — Encounter: Payer: Self-pay | Admitting: Sports Medicine

## 2019-10-23 DIAGNOSIS — T148XXA Other injury of unspecified body region, initial encounter: Secondary | ICD-10-CM | POA: Diagnosis not present

## 2019-10-23 NOTE — Progress Notes (Signed)
    Procedures performed today:    None.  Independent interpretation of tests performed by another provider:   None.  Impression and Recommendations:    Left talar contusion Ariella returns, overall she is doing a lot better. Her motion has improved, she had an inversion injury about 4 months ago. We have done formal physical therapy, I tibiotalar joint injection, she had an MRI that showed medial malleolar and talar kissing bone contusions with a partial ATFL tear. We got a second opinion from Dr. Susa Simmonds with orthopedic foot and ankle surgery who has recommended continued nonoperative management. My advice to her is to discontinue her brace except when at work, and start to use it. I did advise her that we do not have much else to offer her. Return as needed.    ___________________________________________ Ihor Austin. Benjamin Stain, M.D., ABFM., CAQSM. Primary Care and Sports Medicine [facility], [place]  Adjunct Instructor of Family Medicine  [employer]

## 2019-10-23 NOTE — Assessment & Plan Note (Signed)
Charlene Quinn returns, overall she is doing a lot better. Her motion has improved, she had an inversion injury about 4 months ago. We have done formal physical therapy, I tibiotalar joint injection, she had an MRI that showed medial malleolar and talar kissing bone contusions with a partial ATFL tear. We got a second opinion from Charlene Quinn with orthopedic foot and ankle surgery who has recommended continued nonoperative management. My advice to her is to discontinue her brace except when at work, and start to use it. I did advise her that we do not have much else to offer her. Return as needed.

## 2019-11-10 ENCOUNTER — Telehealth: Payer: Self-pay | Admitting: *Deleted

## 2019-11-10 DIAGNOSIS — T148XXA Other injury of unspecified body region, initial encounter: Secondary | ICD-10-CM

## 2019-11-10 MED ORDER — MELOXICAM 15 MG PO TABS: ORAL_TABLET | ORAL | 3 refills | Status: DC

## 2019-11-10 NOTE — Telephone Encounter (Signed)
Pt left a vm stating that since she started back working she's noticed some swelling and was curious if you could send in an anti-inflammatory for her.

## 2019-11-10 NOTE — Telephone Encounter (Signed)
Absolutely, discontinue ibuprofen and start meloxicam.

## 2019-11-24 ENCOUNTER — Other Ambulatory Visit: Payer: Self-pay | Admitting: Sports Medicine

## 2019-11-24 DIAGNOSIS — T148XXA Other injury of unspecified body region, initial encounter: Secondary | ICD-10-CM

## 2019-11-24 MED ORDER — MELOXICAM 15 MG PO TABS: 15.0000 mg | ORAL_TABLET | Freq: Every day | ORAL | 3 refills | Status: AC | PRN

## 2020-05-21 IMAGING — MR MR ANKLE*L* W/O CM
6 series · 40 of 40 positions shown · non-contrast
Comparison: Radiographs 06/18/2019.

CLINICAL DATA: Severe left ankle injury falling down steps 2 weeks
ago with persistent posterior ankle pain. Question talar
osteochondral lesion. No previous relevant surgery.

EXAM:
MRI OF THE LEFT ANKLE WITHOUT CONTRAST
TECHNIQUE: Multiplanar, multisequence MR imaging of the ankle was performed. No
intravenous contrast was administered.

[Series 6: PD fat-sat · axial · 3.0mm · 0.51mm/px · z∈[-38,+75]mm · 7 of 34 slices shown]
[im 1/34]
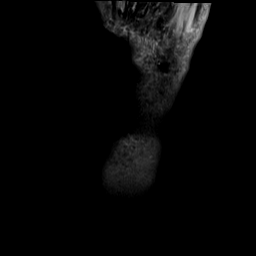
[im 6/34]
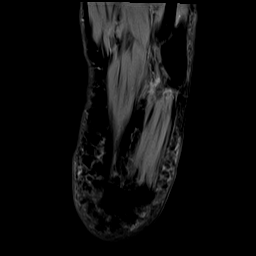
[im 12/34]
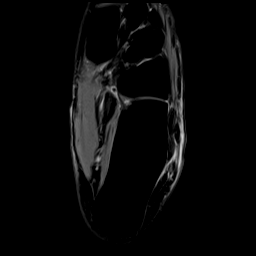
[im 17/34]
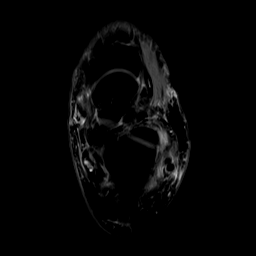
[im 23/34]
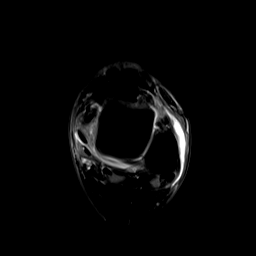
[im 28/34]
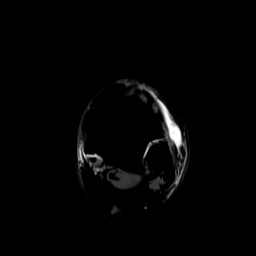
[im 34/34]
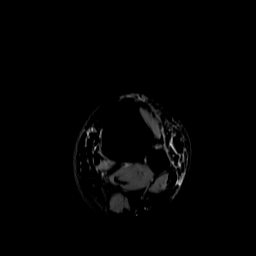

[Series 7: T2 fat-sat · axial · 3.0mm · 0.51mm/px · z∈[-38,+75]mm · 7 of 35 slices shown (1 of 2)]
[im 1/35]
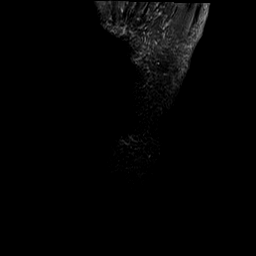
[im 6/35]
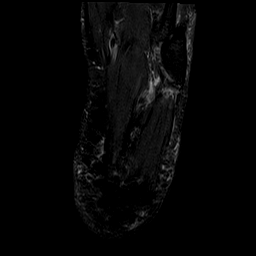
[im 12/35]
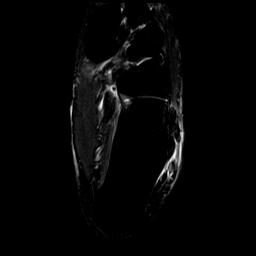
[im 18/35]
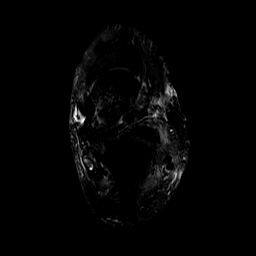
[im 23/35]
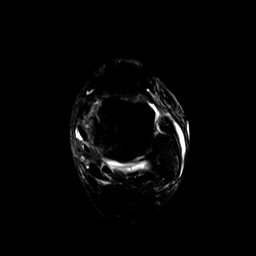
[im 29/35]
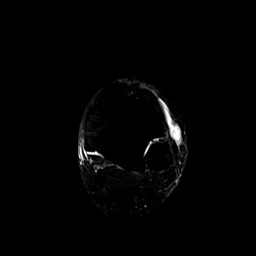
[im 35/35]
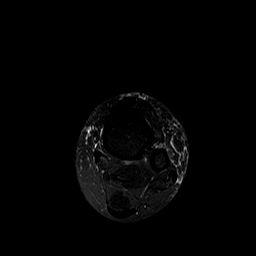

[Series 8: T1 · sagittal · 3.0mm · 0.55mm/px · 5 of 23 slices shown]
[im 1/23]
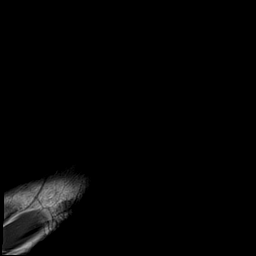
[im 6/23]
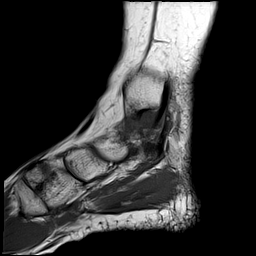
[im 12/23]
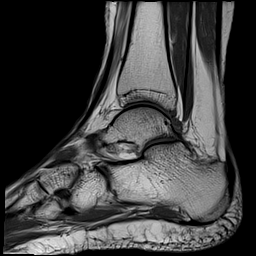
[im 17/23]
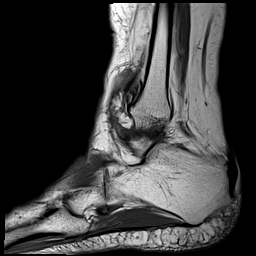
[im 23/23]
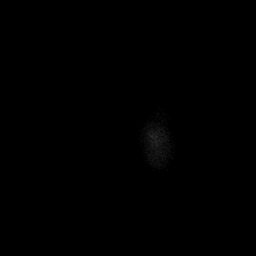

[Series 9: STIR · sagittal · 3.0mm · 0.55mm/px · 5 of 23 slices shown (1 of 2)]
[im 1/23]
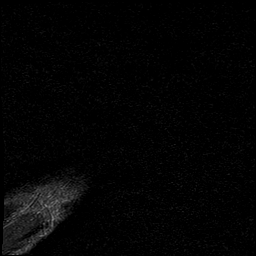
[im 6/23]
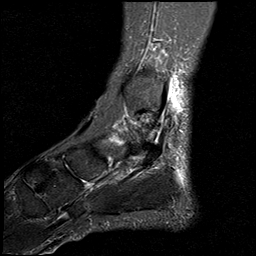
[im 12/23]
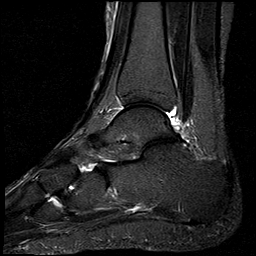
[im 17/23]
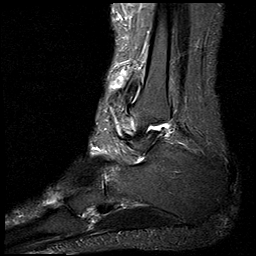
[im 23/23]
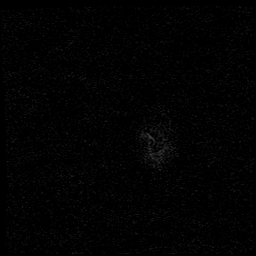

[Series 10: T2 fat-sat · coronal · 3.0mm · 0.55mm/px · 8 of 38 slices shown (2 of 2)]
[im 1/38]
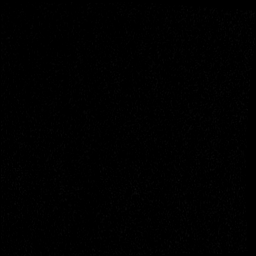
[im 6/38]
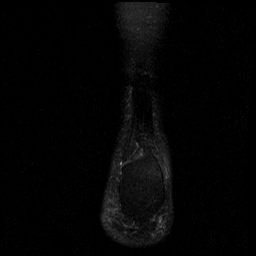
[im 11/38]
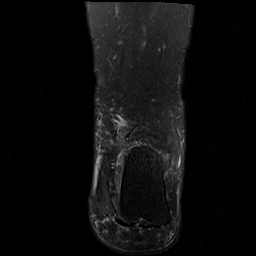
[im 16/38]
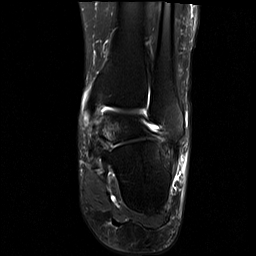
[im 22/38]
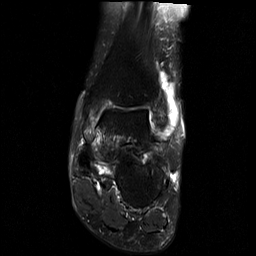
[im 27/38]
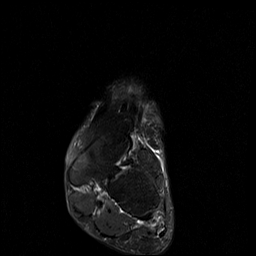
[im 32/38]
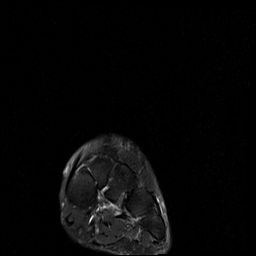
[im 38/38]
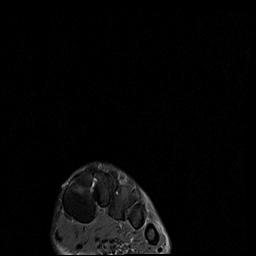

[Series 11: STIR · coronal · 3.0mm · 0.59mm/px · 8 of 38 slices shown (2 of 2)]
[im 1/38]
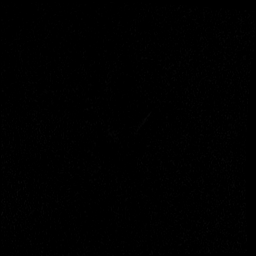
[im 6/38]
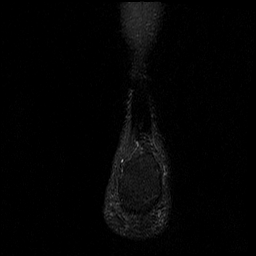
[im 11/38]
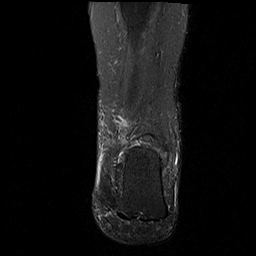
[im 16/38]
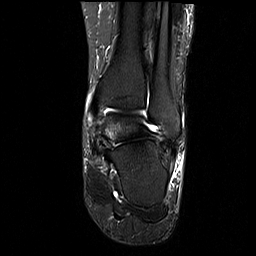
[im 22/38]
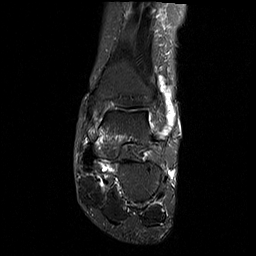
[im 27/38]
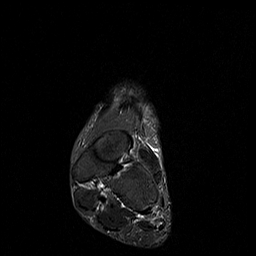
[im 32/38]
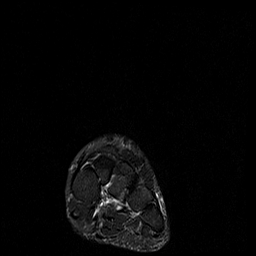
[im 38/38]
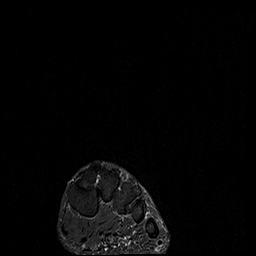

[40 of 40 positions shown; findings below may reference images not displayed]

FINDINGS: TENDONS

Peroneal: Intact and normally positioned.

Posteromedial: Intact and normally positioned. There is a small
amount of fluid within all of the flexor tendon sheaths.

Anterior: Intact and normally positioned.

Achilles: Intact.

Plantar Fascia: Intact.

LIGAMENTS

Lateral: The anterior talofibular ligament appears at least
partially torn. In addition, the anterior inferior tibiofibular
ligament appears torn from its tibial insertion. The posterior
talofibular and calcaneofibular ligaments are intact.

Medial: The deltoid and visualized portions of the spring ligament
appear intact. Surrounding soft tissue edema attributed to contusion
from inversion injury.

CARTILAGE AND BONES

Ankle Joint: No significant ankle joint effusion. The talar dome and
tibial plafond are intact.

Subtalar Joints/Sinus Tarsi: Unremarkable.

Bones: There are kissing contusions involving the medial malleolus
and medial aspects of the talar body and neck. No cortical fracture
identified.

Other: Ill-defined fluid laterally is likely in part due to capsular
injury and extravasated joint fluid.
IMPRESSION: 1. Bone contusions of the medial malleolus and medial talar body and
neck consistent with inversion injury. No cortical fracture or talar
dome osteochondral lesion identified.
2. Lateral ligamentous injuries involving the anterior talofibular
and anterior inferior tibiofibular ligaments with probable lateral
capsular extravasation.
3. The ankle tendons appear intact. Possible mild flexor
tenosynovitis.
